# Patient Record
Sex: Male | Born: 1955 | Race: White | Hispanic: No | Marital: Married | Smoking: Former smoker
Health system: Southern US, Community
[De-identification: ages and names within clinical notes are randomized; demographics above are authoritative.]

## PROBLEM LIST (undated history)

## (undated) DIAGNOSIS — Z955 Presence of coronary angioplasty implant and graft: Secondary | ICD-10-CM

## (undated) DIAGNOSIS — K219 Gastro-esophageal reflux disease without esophagitis: Secondary | ICD-10-CM

## (undated) DIAGNOSIS — E785 Hyperlipidemia, unspecified: Secondary | ICD-10-CM

## (undated) DIAGNOSIS — I219 Acute myocardial infarction, unspecified: Secondary | ICD-10-CM

## (undated) DIAGNOSIS — I5022 Chronic systolic (congestive) heart failure: Secondary | ICD-10-CM

## (undated) DIAGNOSIS — Z972 Presence of dental prosthetic device (complete) (partial): Secondary | ICD-10-CM

## (undated) DIAGNOSIS — H8110 Benign paroxysmal vertigo, unspecified ear: Secondary | ICD-10-CM

## (undated) DIAGNOSIS — Z8679 Personal history of other diseases of the circulatory system: Secondary | ICD-10-CM

## (undated) DIAGNOSIS — I209 Angina pectoris, unspecified: Secondary | ICD-10-CM

## (undated) DIAGNOSIS — I25118 Atherosclerotic heart disease of native coronary artery with other forms of angina pectoris: Secondary | ICD-10-CM

## (undated) DIAGNOSIS — I501 Left ventricular failure: Secondary | ICD-10-CM

## (undated) DIAGNOSIS — F419 Anxiety disorder, unspecified: Secondary | ICD-10-CM

## (undated) DIAGNOSIS — Z950 Presence of cardiac pacemaker: Secondary | ICD-10-CM

## (undated) DIAGNOSIS — Z95810 Presence of automatic (implantable) cardiac defibrillator: Secondary | ICD-10-CM

## (undated) DIAGNOSIS — D649 Anemia, unspecified: Secondary | ICD-10-CM

## (undated) DIAGNOSIS — I509 Heart failure, unspecified: Secondary | ICD-10-CM

## (undated) DIAGNOSIS — Z9889 Other specified postprocedural states: Secondary | ICD-10-CM

## (undated) DIAGNOSIS — I2510 Atherosclerotic heart disease of native coronary artery without angina pectoris: Secondary | ICD-10-CM

## (undated) DIAGNOSIS — N200 Calculus of kidney: Secondary | ICD-10-CM

## (undated) DIAGNOSIS — G2581 Restless legs syndrome: Secondary | ICD-10-CM

## (undated) DIAGNOSIS — R42 Dizziness and giddiness: Secondary | ICD-10-CM

## (undated) DIAGNOSIS — IMO0001 Reserved for inherently not codable concepts without codable children: Secondary | ICD-10-CM

## (undated) DIAGNOSIS — Z8659 Personal history of other mental and behavioral disorders: Secondary | ICD-10-CM

## (undated) DIAGNOSIS — N1831 Chronic kidney disease, stage 3a: Secondary | ICD-10-CM

## (undated) HISTORY — PX: CORONARY ANGIOPLASTY: SHX604

## (undated) HISTORY — PX: EXTRACORPOREAL SHOCK WAVE LITHOTRIPSY: SHX1557

## (undated) HISTORY — DX: Hyperlipidemia, unspecified: E78.5

## (undated) HISTORY — PX: CARDIAC CATHETERIZATION: SHX172

## (undated) HISTORY — DX: Gastro-esophageal reflux disease without esophagitis: K21.9

## (undated) HISTORY — PX: CORONARY STENT PLACEMENT: SHX1402

## (undated) HISTORY — DX: Acute myocardial infarction, unspecified: I21.9

## (undated) HISTORY — PX: INSERT / REPLACE / REMOVE PACEMAKER: SUR710

## (undated) HISTORY — PX: PACEMAKER INSERTION: SHX728

---

## 2011-05-06 DIAGNOSIS — I219 Acute myocardial infarction, unspecified: Secondary | ICD-10-CM

## 2011-05-06 HISTORY — DX: Acute myocardial infarction, unspecified: I21.9

## 2011-05-13 ENCOUNTER — Inpatient Hospital Stay: Payer: Self-pay | Admitting: Internal Medicine

## 2011-08-07 ENCOUNTER — Encounter: Payer: Self-pay | Admitting: Internal Medicine

## 2011-09-04 ENCOUNTER — Encounter: Payer: Self-pay | Admitting: Internal Medicine

## 2011-10-04 ENCOUNTER — Encounter: Payer: Self-pay | Admitting: Internal Medicine

## 2012-03-22 DIAGNOSIS — K219 Gastro-esophageal reflux disease without esophagitis: Secondary | ICD-10-CM | POA: Insufficient documentation

## 2012-03-22 DIAGNOSIS — I2510 Atherosclerotic heart disease of native coronary artery without angina pectoris: Secondary | ICD-10-CM | POA: Insufficient documentation

## 2012-03-22 DIAGNOSIS — I10 Essential (primary) hypertension: Secondary | ICD-10-CM | POA: Insufficient documentation

## 2012-03-27 ENCOUNTER — Ambulatory Visit: Payer: Self-pay | Admitting: Cardiology

## 2012-03-27 LAB — URINALYSIS, COMPLETE
Blood: NEGATIVE
Ketone: NEGATIVE
Leukocyte Esterase: NEGATIVE
Nitrite: NEGATIVE
Ph: 7 (ref 4.5–8.0)
Protein: NEGATIVE
Specific Gravity: 1.018 (ref 1.003–1.030)
WBC UR: 1 /HPF (ref 0–5)

## 2012-03-27 LAB — CBC WITH DIFFERENTIAL/PLATELET
Basophil #: 0 10*3/uL (ref 0.0–0.1)
Eosinophil #: 0.1 10*3/uL (ref 0.0–0.7)
HCT: 40.1 % (ref 40.0–52.0)
Lymphocyte #: 2.1 10*3/uL (ref 1.0–3.6)
Lymphocyte %: 26.1 %
MCHC: 35.7 g/dL (ref 32.0–36.0)
Monocyte #: 0.6 x10 3/mm (ref 0.2–1.0)
Monocyte %: 7.4 %
Neutrophil #: 5.3 10*3/uL (ref 1.4–6.5)
Neutrophil %: 64.6 %
Platelet: 260 10*3/uL (ref 150–440)
RBC: 4.52 10*6/uL (ref 4.40–5.90)
RDW: 13.8 % (ref 11.5–14.5)
WBC: 8.2 10*3/uL (ref 3.8–10.6)

## 2012-03-27 LAB — BASIC METABOLIC PANEL
Anion Gap: 8 (ref 7–16)
BUN: 18 mg/dL (ref 7–18)
Co2: 24 mmol/L (ref 21–32)
Creatinine: 1.23 mg/dL (ref 0.60–1.30)
EGFR (African American): >60
EGFR (Non-African Amer.): >60
Potassium: 4.9 mmol/L (ref 3.5–5.1)
Sodium: 136 mmol/L (ref 136–145)

## 2012-03-27 LAB — PROTIME-INR: INR: 1

## 2012-04-05 ENCOUNTER — Ambulatory Visit: Payer: Self-pay | Admitting: Cardiology

## 2013-06-02 ENCOUNTER — Ambulatory Visit: Payer: Self-pay | Admitting: Gastroenterology

## 2013-06-03 LAB — PATHOLOGY REPORT

## 2013-11-10 LAB — BASIC METABOLIC PANEL
Anion Gap: 6 — ABNORMAL LOW (ref 7–16)
BUN: 10 mg/dL (ref 7–18)
CREATININE: 1.2 mg/dL (ref 0.60–1.30)
Calcium, Total: 9 mg/dL (ref 8.5–10.1)
Chloride: 107 mmol/L (ref 98–107)
Co2: 27 mmol/L (ref 21–32)
EGFR (African American): >60
GLUCOSE: 99 mg/dL (ref 65–99)
OSMOLALITY: 278 (ref 275–301)
Potassium: 3.7 mmol/L (ref 3.5–5.1)
SODIUM: 140 mmol/L (ref 136–145)

## 2013-11-10 LAB — CBC WITH DIFFERENTIAL/PLATELET
Basophil #: 0 10*3/uL (ref 0.0–0.1)
Basophil %: 0.2 %
EOS PCT: 0.5 %
Eosinophil #: 0 10*3/uL (ref 0.0–0.7)
HCT: 42.3 % (ref 40.0–52.0)
HGB: 13.4 g/dL (ref 13.0–18.0)
LYMPHS PCT: 34.2 %
Lymphocyte #: 2.3 10*3/uL (ref 1.0–3.6)
MCH: 28.3 pg (ref 26.0–34.0)
MCHC: 31.8 g/dL — ABNORMAL LOW (ref 32.0–36.0)
MCV: 89 fL (ref 80–100)
MONOS PCT: 7.3 %
Monocyte #: 0.5 x10 3/mm (ref 0.2–1.0)
NEUTROS ABS: 3.9 10*3/uL (ref 1.4–6.5)
Neutrophil %: 57.8 %
PLATELETS: 224 10*3/uL (ref 150–440)
RBC: 4.75 10*6/uL (ref 4.40–5.90)
RDW: 13.7 % (ref 11.5–14.5)
WBC: 6.8 10*3/uL (ref 3.8–10.6)

## 2013-11-10 LAB — URINALYSIS, COMPLETE
BACTERIA: NONE SEEN
Bilirubin,UR: NEGATIVE
Blood: NEGATIVE
Glucose,UR: NEGATIVE mg/dL (ref 0–75)
Hyaline Cast: 3
KETONE: NEGATIVE
Leukocyte Esterase: NEGATIVE
Nitrite: NEGATIVE
PROTEIN: NEGATIVE
Ph: 5 (ref 4.5–8.0)
Specific Gravity: 1.024 (ref 1.003–1.030)
Squamous Epithelial: NONE SEEN
WBC UR: 2 /HPF (ref 0–5)

## 2013-11-10 LAB — TROPONIN I: Troponin-I: <0.02 ng/mL

## 2013-11-11 ENCOUNTER — Observation Stay: Payer: Self-pay | Admitting: Family Medicine

## 2013-11-11 LAB — TROPONIN I
Troponin-I: 0.02 ng/mL
Troponin-I: 0.03 ng/mL

## 2013-11-21 DIAGNOSIS — I509 Heart failure, unspecified: Secondary | ICD-10-CM | POA: Insufficient documentation

## 2013-11-21 DIAGNOSIS — I5022 Chronic systolic (congestive) heart failure: Secondary | ICD-10-CM | POA: Insufficient documentation

## 2013-11-21 DIAGNOSIS — R42 Dizziness and giddiness: Secondary | ICD-10-CM | POA: Insufficient documentation

## 2013-11-25 DIAGNOSIS — R55 Syncope and collapse: Secondary | ICD-10-CM | POA: Insufficient documentation

## 2014-03-05 DIAGNOSIS — E782 Mixed hyperlipidemia: Secondary | ICD-10-CM | POA: Insufficient documentation

## 2014-09-22 NOTE — Op Note (Signed)
PATIENT NAME:  Elijah [name], Elijah [name] MR#:  [id number] DATE OF BIRTH:  1956/05/21  DATE OF PROCEDURE:  04/05/2012  PROCEDURE: Implantation of [name] single-chamber implantable cardioverter defibrillator.   INDICATION: Ejection fraction 30%, [place] Heart Association class II symptoms on optimal medical therapy for primary prevention of sudden cardiac arrest.   DETAILS OF PROCEDURE: The patient was brought to the operating room in [name] fasting, nonsedated state. Written informed consent had been obtained and placed in the patient's permanent medical record. The patient was prepped and draped in the usual sterile manner. The area of the left infraclavicular fossa was scrubbed with chlorhexidine. Using 20 mL of local anesthetic, the area of the left infraclavicular fossa was anesthetized. Using [name] scalpel, [name] 3- cm incision was made in the left infraclavicular fossa. Using [name] combination of electrocautery and blunt dissection, an automatic implantable cardiac defibrillator pocket was fastened in the usual manner just above the pectoralis muscle. Access to central circulation was obtained using fluoroscopic guidance in the modified Seldinger technique. [name] guidewire was easily passed through the axillary vein into the area of the inferior vena cava. Over the guidewire, [name] dilator and sheath were placed. The lead was subsequently placed through the dilator after the guidewire and dilator were removed. Once the lead was advanced through the sheath it was taken to the right ventricular outflow tract. The lead was then withdrawn to the mid interventricular septum and the helical active fixation mechanism was deployed. Sensing for R waves in that position were 5.6 mV, slew rate of 1.2 V/sec, impedance 547 ohms, threshold 0.8 Vat 0.5 ms. The lead was secured in place and sewn to the pre pectoralis fascia using 0 silk suture. Adequate redundancy was confirmed under fluoroscopy. The pocket was then irrigated with copious amounts of  gentamicin solution. Adequate hemostasis was obtained. The implantable cardiac defibrillator generator was attached to the implantable cardiac defibrillator lead and secured in the pocket. The pocket was subsequently closed with running layers of 2-0 and 3-0 Vicryl and the subcuticular layer with 4-0 Vicryl. Steri-Strips were applied over the incision site as well as [name] sterile gauze and OpSite, and [name] pressure dressing was applied using Elastoplast. There were no complications noted at the conclusion of the procedure. Fluoroscopy time was 1 minute and 40 seconds. Estimated blood loss was minimal.   SUMMARY OF IMPLANTED HARDWARE: The patient received [name] Medtronic single-chamber implantable cardiac defibrillator, model Protecta S, model number D334VRM, serial number [id number].  The RV lead was [name] Medtronic [id number] 62-cm lead, serial number [id number], all implanted 04/05/2012.  FINAL PROGRAM PARAMETERS: Device was program  mode VVI with lower rate limit of 40. The VF detection was set at 200 beats [name] minute with [name] detection interval of 30 out of 40. There was [name] VT monitor zone programmed at 171 beats per minute.     ____________________________ Anna GenreKevin L. Maisie Fushomas, MD klt:bjt D: 04/05/2012 14:44:48 ET T: 04/05/2012 16:15:30 ET JOB#: [id number]  cc: Caryn BeeKevin L. Maisie Fushomas, MD, <Dictator> Sharion SettlerKEVIN L Toshie Demelo MD ELECTRONICALLY SIGNED 05/10/2012 14:44

## 2014-09-26 NOTE — Discharge Summary (Signed)
PATIENT NAME:  Elijah [name], Elijah [name] MR#:  [id number] DATE OF BIRTH:  22-May-1956  DATE OF ADMISSION:  11/11/2013 DATE OF DISCHARGE:  11/12/2013  DISCHARGE DIAGNOSES: Syncope.   DISCHARGE MEDICATIONS: 1. Ventolin 90 mcg 2 puffs every four hours p.r.n. for wheezing.  2. Aspirin 81 mg p.o. daily.  3. Effient 10 mg  p.o. daily.  4. Ranitidine 150 mg p.o. b.i.d.  5. Lisinopril 5 mg p.o. daily.  6. Vitamin B12 1000 mcg p.o. daily.  7. Ranexa 1000 mg p.o. b.i.d.  8. Pravastatin 40 mg 2 tabs p.o. daily.  9. Spironolactone 25 mg p.o. daily.   MEDICATIONS TO HOLD: Metoprolol.   CONSULTS: Cardiology.   PROCEDURES: [name] CT of the head was negative. Carotid studies were negative. Chest x-ray negative.   PERTINENT LABORATORIES AND STUDIES: Cardiac enzymes were negative. EKG negative.   BRIEF HOSPITAL COURSE: Syncope. The patient initially admitted with syncopal episode. Upon admission his initial work-up was negative. Cardiac enzymes and EKG and electrolytes were all within normal limits. He underwent [name] CT of the head and carotid studies all of which was negative. He was evaluated by cardiology who recommended no further intervention. Concerns that this syncopal episode was probably do to vasovagal episode; however, given his bradycardia that was found upon admission, we held his metoprolol at this time. May need to restart that back for cardiac protection in [name] few weeks. He is to follow up with Dr. Burnadette PopLinthavong within 10 days. The patient is stable to be discharged home.   ____________________________ Marisue IvanKanhka Gae Bihl, MD kl:sg D: 11/12/2013 12:49:26 ET T: 11/12/2013 13:11:39 ET JOB#: [id number]  cc: Marisue IvanKanhka Michon Kaczmarek, MD, <Dictator> Marisue IvanKANHKA Deatra Mcmahen MD ELECTRONICALLY SIGNED 12/02/2013 11:33

## 2014-09-26 NOTE — Consult Note (Signed)
PATIENT NAME:  Elijah [name], Elijah [name] MR#:  [id number] DATE OF BIRTH:  Oct 29, 1955  DATE OF CONSULTATION:  11/11/2013  REFERRING PHYSICIAN:  Dr. Clint [name] CONSULTING PHYSICIAN:  Marcina MillardAlexander Derrian Poli, MD  PRIMARY CARE PHYSICIAN: Dr. Burnadette [name].  CARDIOLOGIST:  Dr. Lady [name].   CHIEF COMPLAINT:  "I passed out."   HISTORY OF PRESENT ILLNESS: The patient is [name] 59 year old gentleman with known history of coronary artery disease, status post prior PCI and coronary stent with known ischemic cardiomyopathy, status post ICD. The patient reports that he was in his usual state of health until last evening while at work when he experienced an episode of nausea followed by lightheadedness and diaphoresis with brief loss of consciousness. The patient reports that it only lasted [name] few seconds. After less than [name] minute, the patient was back to normal. [facility] Room where EKG was nondiagnostic. The patient is ruled out for myocardial infarction by CPK, isoenzymes and troponin. The patient reports that he feels he is back at baseline today.   PAST MEDICAL HISTORY: 1.  Status post posterior wall MI and coronary stent left circumflex 12/12. 2.  Status post coronary stent right coronary artery 01/13.  3.  Ischemic cardiomyopathy with LVEF of 30%.  4.  Status post ICD 11/13.  5.  Hypertension.  6.  Hyperlipidemia.   MEDICATIONS: Metoprolol succinate 25 mg daily, Ranexa 1000 mg daily, Pravachol 80 mg daily, Effient 10 mg daily, lisinopril 5 mg daily, aspirin 81 mg daily, spironolactone 25 mg daily, MiraLAX 255 grams daily, ranitidine 150 mg b.i.d., Ventolin inhaler 90 mcg 2 puffs q. 6 hours p.r.n., vitamin B12 1000 mg daily.   SOCIAL HISTORY: The patient is married, lives with his wife. He works full time. He quit tobacco abuse 2011.   FAMILY HISTORY: No immediate family history of coronary artery disease or myocardial infarction.  REVIEW OF SYSTEMS:  CONSTITUTIONAL: No fever or chills.  EYES: No  blurry vision.  EARS: No hearing loss.  RESPIRATORY: No shortness of breath.  CARDIOVASCULAR: No chest pain.  GASTROINTESTINAL: No nausea, vomiting or diarrhea.  GENITOURINARY: No dysuria or hematuria.  ENDOCRINE: No polyuria or polydipsia.  MUSCULOSKELETAL: No arthralgias or myalgias.  NEUROLOGICAL: No focal muscle weakness or numbness.  PSYCHOLOGICAL: No depression or anxiety.   PHYSICAL EXAMINATION: HEENT: Pupils equal and reactive to light and accommodation.  NECK: Supple without thyromegaly.  LUNGS: Clear.  CARDIOVASCULAR: Normal JVP. Normal PMI. Regular rate and rhythm. Normal S1, S2. No appreciable gallop, murmur or rub.  ABDOMEN: Soft and nontender. Pulses were intact bilaterally.  MUSCULOSKELETAL: Normal muscle tone.  NEUROLOGIC: The patient is alert and oriented x 3. Motor and sensory both grossly intact.   IMPRESSION: [name] 59 year old gentleman with [name] syncopal episode which sounds vasovagal in nature. The patient denies any delivered shocks from his defibrillator. The patient did not experience any chest pain or symptoms of congestive heart failure. The patient has ruled out for myocardial infarction by CPK, isoenzymes and troponin.   RECOMMENDATIONS: 1.  Agree with overall current therapy.  2.  Would defer full dose anticoagulation. 3.  Await brain CT and carotid ultrasound results.  4.  Would defer further cardiac diagnostics at this time.  5.  If patient does well overnight, may consider discharge in the morning.   ____________________________ Marcina MillardAlexander Annora Guderian, MD ap:ce D: 11/11/2013 17:03:51 ET T: 11/11/2013 19:02:20 ET JOB#: [id number]  cc: Marcina MillardAlexander Garison Genova, MD, <Dictator> Marcina MillardALEXANDER Brockton Mckesson MD ELECTRONICALLY SIGNED 11/18/2013 8:29

## 2014-09-26 NOTE — H&P (Signed)
PATIENT NAME:  [name], Elijah A MR#:  [id number] DATE OF BIRTH:  1956/01/03  DATE OF ADMISSION:  11/10/2013  REFERRING PHYSICIAN: Carollee Massed.   PRIMARY CARE PHYSICIAN: Linthavong.    CHIEF COMPLAINT: Passing out.   HISTORY OF PRESENT ILLNESS: A 59 year old Caucasian gentleman with past medical history of coronary artery disease status post PCI and stent placement, ischemic cardiomyopathy with unknown ejection fraction; however, he does have a permanent pacemaker and AICD that were placed, presenting now for a syncopal episode. Describes acute onset of syncope with preceding prodrome of nausea, lightheadedness, diaphoresis, followed by loss of consciousness without associated head trauma. His loss of consciousness lasted only a few seconds. Afterward still had some nauseousness but no further symptomatology. Denies any chest pain, palpitations or shortness of breath. Currently, he is without complaints.   REVIEW OF SYSTEMS:  CONSTITUTIONAL: Denies fever, fatigue, weakness.  EYES: Denies blurred vision, double vision, eye pain.  EARS, NOSE, THROAT: Denies tinnitus, ear pain, hearing loss.  RESPIRATORY: Denies cough, wheeze, shortness of breath.  CARDIOVASCULAR: Denies chest pain, palpitations, edema. Positive for syncope as described above.  GASTROINTESTINAL: Positive for nausea as described above. Denies vomiting, diarrhea, abdominal pain.  GENITOURINARY: Denies dysuria or hematuria.  ENDOCRINE: Denies nocturia or thyroid problems.  HEMATOLOGIC AND LYMPHATIC: Denies easy bruising or bleeding.  SKIN: Denies rashes or lesions.  MUSCULOSKELETAL: Denies pain in neck, back, shoulder, knees, hips or arthritic symptoms.  NEUROLOGIC: Denies paralysis, paresthesias.  PSYCHIATRIC: Denies anxiety or depressive symptoms.   Otherwise, full review of systems performed by me is negative.   PAST MEDICAL HISTORY: Coronary artery disease status post PCI and stent placement, ischemic cardiomyopathy,  hyperlipidemia, gastroesophageal reflux disease, history of permanent pacemaker insertion as well as AICD placement.   SOCIAL HISTORY: Remote tobacco use. Denies any alcohol usage or drug usage.   FAMILY HISTORY: Denies any known cardiovascular or pulmonary disorders.   ALLERGIES: SULFA DRUGS AND VICODIN.   HOME MEDICATIONS: Include spironolactone 25 mg p.o. daily, aspirin 81 mg p.o. daily, lisinopril 5 mg p.o. daily, Ranexa 1000 mg p.o. b.i.d., pravastatin 40 mg 2 tablets p.o. daily, Effient 10 mg p.o. daily, metoprolol succinate 25 mg extended release p.o. daily, Ventolin 90 mcg inhalation 2 puffs 4 times daily as needed for shortness of breath, ranitidine 150 mg p.o. b.i.d., vitamin B12 1000 mcg p.o. daily.   PHYSICAL EXAMINATION:  VITAL SIGNS: Temperature 98.4, heart rate 62, respirations 18, blood pressure 143/82, saturating 97% on room air. Weight 77.1 kg, BMI 26.7.  GENERAL: Well-nourished, well-developed, Caucasian gentleman, currently in no acute distress.  HEAD: Normocephalic, atraumatic.  EYES: Pupils equal, round and reactive to light. Extraocular muscles intact. No scleral icterus.  MOUTH: Moist mucosal membranes. Dentition intact. No abscess noted.  EARS, NOSE, THROAT: Clear without exudates. No external lesions.  NECK: Supple. No thyromegaly. No nodules. No JVD.  PULMONARY: Clear to auscultation bilaterally without wheezes, rubs or rhonchi. No use of accessory muscles. Good respiratory effort.  CHEST: Nontender to palpation.  CARDIOVASCULAR: S1, S2, bradycardic. No murmurs, rubs or gallops. No edema. Pedal pulses 2+ bilaterally.  GASTROINTESTINAL: Soft, nontender, nondistended. No masses. Positive bowel sounds. No hepatosplenomegaly.  MUSCULOSKELETAL: No swelling, clubbing or edema. Range of motion full in all extremities.  NEUROLOGIC: Cranial nerves II through XII intact. No gross focal neurological deficits. Sensation intact. Reflexes intact.  SKIN: No ulceration, lesions,  rashes, cyanosis. Skin warm, dry. Turgor intact.  PSYCHIATRIC: Mood and affect within normal limits. The patient is awake, alert, oriented x 3. Insight  and judgment intact.   LABORATORY DATA: Sodium 140, potassium 3.7, chloride 107, bicarb 27, BUN 10, creatinine 1.2, glucose 99. Troponin I less than 0.02. WBC 6.8, hemoglobin 13.4, platelets 224. Urinalysis negative for evidence of infection. EKG performed revealing sinus bradycardia, heart rate 53.   ASSESSMENT AND PLAN: A 59 year old Caucasian gentleman with history of coronary artery disease status post percutaneous coronary intervention and stent placement, ischemic cardiomyopathy with unknown ejection fraction; however, he does have permanent pacemaker and automatic implantable cardiac defibrillator placement, presenting after a syncopal episode.  1. Syncope: Place on telemetry under observational status. Consult cardiology. Will need his automatic implantable cardiac defibrillator and permanent pacemaker checked given bradycardia.  2. Bradycardia: Blood pressure stable. Will hold beta blockers and atrioventricular nodal agents.  3. Coronary artery disease: Continue aspirin and statin therapy, as well as Effient.  4. Hypertension: Continue ACE inhibitors and spironolactone; however, hold his Toprol.  5. Gastroesophageal reflux disease: Continue Zantac.   6. Venous thromboembolism prophylaxis: Continue with Effient.   The patient is FULL CODE.   TIME SPENT: 45 minutes.   ____________________________ Cletis Athensavid K. Zineb Glade, MD dkh:gb D: 11/10/2013 22:32:08 ET T: 11/10/2013 22:47:37 ET JOB#: [id number]  cc: Cletis Athensavid K. Holland Nickson, MD, <Dictator> Zaylen Susman Synetta ShadowK Caelyn Route MD ELECTRONICALLY SIGNED 11/11/2013 21:05

## 2014-09-27 NOTE — Discharge Summary (Signed)
PATIENT NAME:  Elijah [name], Elijah [name] MR#:  [id number] DATE OF BIRTH:  07/15/1955  DATE OF ADMISSION:  05/13/2011 DATE OF DISCHARGE:  05/16/2011  DISCHARGE DIAGNOSES:  1. Known coronary artery disease with unstable angina.  2. Acute posterolateral myocardial infarction.  3. Coronary artery disease.  4. Hyperlipidemia.   HISTORY: This is [name] 59 year old male with known cardiovascular disease with progressive angina. Patient has underwent cardiac catheterization showing [name] chronically occluded right coronary artery with collateralization with [name] significant stenosis of an obtuse marginal of 85%. The left anterior descending artery had minor irregularities. The patient had [name] PCI and stent placement of the circumflex artery to obtuse marginal without complication. Later that evening the patient did have some nausea and began having some mild EKG changes and then the patient had further discomfort in the morning. The patient was placed back on Integrilin, heparin and nitroglycerin with [name] headache and therefore ended up with [name] troponin of 30. The patient was continued on appropriate medications throughout the weekend until he felt much better. At that time the patient had discussion of possible further intervention and he wished to use medical management of potential problems that had occurred including the possibility of acute occlusion of the circumflex obtuse marginal stent and/or further changes to the right coronary artery. The patient then underwent [name] treadmill EKG showing some mild ST depression but reasonable exercise tolerance and no rhythm disturbances. The patient had some weakness. The patient therefore needed further evaluation and had cardiac catheterization repeated showing occluded obtuse marginal at the stent site with completed myocardial infarction and no change in collateralization to right coronary artery which he was [name] subacute occlusion from previous. The patient then was ambulating well without  any further significant symptoms and had been on appropriate medications. The patient could not tolerate any beta blocker due to hypotension and ACE inhibitor due to hypotension therefore Plavix and Effient were used and the patient felt fairly well. He was discharged home in good condition with follow up in two days for further evaluation and treatment options.   DISCHARGE MEDICATIONS:  1. Effient 10 mg. 2. Aspirin 325 mg each day.  3. Pravastatin 20 mg p.o. daily.   ____________________________ Lamar BlinksBruce J. Madylin Fairbank, MD bjk:cms D: 05/16/2011 16:51:27 ET T: 05/17/2011 11:32:40 ET JOB#: [id number]  cc: Lamar BlinksBruce J. Janeliz Prestwood, MD, <Dictator> Lamar BlinksBRUCE J Tarina Volk MD ELECTRONICALLY SIGNED 06/12/2011 9:46

## 2015-02-15 ENCOUNTER — Emergency Department
Admission: EM | Admit: 2015-02-15 | Discharge: 2015-02-15 | Disposition: A | Discharge status: Home / Self Care | Payer: Managed Care, Other (non HMO) | Attending: Emergency Medicine | Admitting: Emergency Medicine

## 2015-02-15 ENCOUNTER — Encounter: Payer: Self-pay | Admitting: Emergency Medicine

## 2015-02-15 ENCOUNTER — Emergency Department: Payer: Managed Care, Other (non HMO)

## 2015-02-15 DIAGNOSIS — R1032 Left lower quadrant pain: Secondary | ICD-10-CM | POA: Diagnosis present

## 2015-02-15 DIAGNOSIS — Z79899 Other long term (current) drug therapy: Secondary | ICD-10-CM | POA: Insufficient documentation

## 2015-02-15 DIAGNOSIS — Z7982 Long term (current) use of aspirin: Secondary | ICD-10-CM | POA: Diagnosis not present

## 2015-02-15 DIAGNOSIS — N23 Unspecified renal colic: Secondary | ICD-10-CM | POA: Insufficient documentation

## 2015-02-15 DIAGNOSIS — Z7902 Long term (current) use of antithrombotics/antiplatelets: Secondary | ICD-10-CM | POA: Insufficient documentation

## 2015-02-15 HISTORY — DX: Calculus of kidney: N20.0

## 2015-02-15 LAB — URINALYSIS COMPLETE WITH MICROSCOPIC (ARMC ONLY)
BACTERIA UA: NONE SEEN
Bilirubin Urine: NEGATIVE
Glucose, UA: NEGATIVE mg/dL
Ketones, ur: NEGATIVE mg/dL
LEUKOCYTES UA: NEGATIVE
NITRITE: NEGATIVE
PROTEIN: NEGATIVE mg/dL
SPECIFIC GRAVITY, URINE: 1.019 (ref 1.005–1.030)
pH: 5 (ref 5.0–8.0)

## 2015-02-15 LAB — COMPREHENSIVE METABOLIC PANEL
ALBUMIN: 3.9 g/dL (ref 3.5–5.0)
ALK PHOS: 53 U/L (ref 38–126)
ALT: 14 U/L — AB (ref 17–63)
ANION GAP: 8 (ref 5–15)
AST: 27 U/L (ref 15–41)
BILIRUBIN TOTAL: 1 mg/dL (ref 0.3–1.2)
BUN: 13 mg/dL (ref 6–20)
CALCIUM: 9.1 mg/dL (ref 8.9–10.3)
CO2: 24 mmol/L (ref 22–32)
CREATININE: 1.25 mg/dL — AB (ref 0.61–1.24)
Chloride: 109 mmol/L (ref 101–111)
GFR calc non Af Amer: >60 mL/min (ref >60)
GLUCOSE: 117 mg/dL — AB (ref 65–99)
Potassium: 3.6 mmol/L (ref 3.5–5.1)
SODIUM: 141 mmol/L (ref 135–145)
TOTAL PROTEIN: 6.8 g/dL (ref 6.5–8.1)

## 2015-02-15 LAB — CBC WITH DIFFERENTIAL/PLATELET
BASOS ABS: 0 10*3/uL (ref 0–0.1)
BASOS PCT: 0 %
EOS ABS: 0.1 10*3/uL (ref 0–0.7)
Eosinophils Relative: 2 %
HCT: 38.3 % — ABNORMAL LOW (ref 40.0–52.0)
Hemoglobin: 12.9 g/dL — ABNORMAL LOW (ref 13.0–18.0)
Lymphocytes Relative: 35 %
Lymphs Abs: 2.2 10*3/uL (ref 1.0–3.6)
MCH: 30.2 pg (ref 26.0–34.0)
MCHC: 33.6 g/dL (ref 32.0–36.0)
MCV: 89.7 fL (ref 80.0–100.0)
MONO ABS: 0.5 10*3/uL (ref 0.2–1.0)
MONOS PCT: 9 %
NEUTROS ABS: 3.3 10*3/uL (ref 1.4–6.5)
Neutrophils Relative %: 54 %
PLATELETS: 199 10*3/uL (ref 150–440)
RBC: 4.27 MIL/uL — ABNORMAL LOW (ref 4.40–5.90)
RDW: 13.8 % (ref 11.5–14.5)
WBC: 6.2 10*3/uL (ref 3.8–10.6)

## 2015-02-15 MED ORDER — OXYCODONE-ACETAMINOPHEN 5-325 MG PO TABS: 1.0000 | ORAL_TABLET | Freq: Four times a day (QID) | ORAL | Status: DC | PRN

## 2015-02-15 MED ORDER — ONDANSETRON HCL 4 MG/2ML IJ SOLN
4.0000 mg | Freq: Once | INTRAMUSCULAR | Status: AC
  Administered 2015-02-15: 4 mg via INTRAVENOUS
  Filled 2015-02-15: qty 2

## 2015-02-15 MED ORDER — ONDANSETRON 8 MG PO TBDP: 8.0000 mg | ORAL_TABLET | Freq: Three times a day (TID) | ORAL | Status: DC | PRN

## 2015-02-15 MED ORDER — KETOROLAC TROMETHAMINE 30 MG/ML IJ SOLN
30.0000 mg | Freq: Once | INTRAMUSCULAR | Status: AC
  Administered 2015-02-15: 30 mg via INTRAVENOUS
  Filled 2015-02-15: qty 1

## 2015-02-15 MED ORDER — NAPROXEN 500 MG PO TABS: 500.0000 mg | ORAL_TABLET | Freq: Two times a day (BID) | ORAL | Status: DC

## 2015-02-15 MED ORDER — TAMSULOSIN HCL 0.4 MG PO CAPS: 0.4000 mg | ORAL_CAPSULE | Freq: Every day | ORAL | Status: DC

## 2015-02-15 NOTE — ED Notes (Signed)
Pt presents with left flank acute onset this am. Hx of kidney stones.

## 2015-02-15 NOTE — Discharge Instructions (Signed)
You were prescribed a medication that is potentially sedating. Do not drink alcohol, drive or participate in any other potentially dangerous activities while taking this medication as it may make you sleepy. Do not take this medication with any other sedating medications, either prescription or over-the-counter. If you were prescribed Percocet or Vicodin, do not take these with acetaminophen (Tylenol) as it is already contained within these medications. °  °Opioid pain medications (or "narcotics") can be habit forming.  Use it as little as possible to achieve adequate pain control.  Do not use or use it with extreme caution if you have a history of opiate abuse or dependence.  If you are on a pain contract with your primary care doctor or a pain specialist, be sure to let them know you were prescribed this medication today from the [facility].  This medication is intended for your use only - do not give any to anyone else and keep it in a secure place where nobody else, especially children and pets, have access to it.  It will also cause or worsen constipation, so you may want to consider taking an over-the-counter stool softener while you are taking this medication. ° ° °Kidney Stones °Kidney stones (urolithiasis) are deposits that form inside your kidneys. The intense pain is caused by the stone moving through the urinary tract. When the stone moves, the ureter goes into spasm around the stone. The stone is usually passed in the urine.  °CAUSES  °· A disorder that makes certain neck glands produce too much parathyroid hormone (primary hyperparathyroidism). °· A buildup of uric acid crystals, similar to gout in your joints. °· Narrowing (stricture) of the ureter. °· A kidney obstruction present at birth (congenital obstruction). °· Previous surgery on the kidney or ureters. °· Numerous kidney infections. °SYMPTOMS  °· Feeling sick to your stomach (nauseous). °· Throwing up  (vomiting). °· Blood in the urine (hematuria). °· Pain that usually spreads (radiates) to the groin. °· Frequency or urgency of urination. °DIAGNOSIS  °· Taking a history and physical exam. °· Blood or urine tests. °· CT scan. °· Occasionally, an examination of the inside of the urinary bladder (cystoscopy) is performed. °TREATMENT  °· Observation. °· Increasing your fluid intake. °· Extracorporeal shock wave lithotripsy--This is a noninvasive procedure that uses shock waves to break up kidney stones. °· Surgery may be needed if you have severe pain or persistent obstruction. There are various surgical procedures. Most of the procedures are performed with the use of small instruments. Only small incisions are needed to accommodate these instruments, so recovery time is minimized. °The size, location, and chemical composition are all important variables that will determine the proper choice of action for you. Talk to your health care provider to better understand your situation so that you will minimize the risk of injury to yourself and your kidney.  °HOME CARE INSTRUCTIONS  °· Drink enough water and fluids to keep your urine clear or pale yellow. This will help you to pass the stone or stone fragments. °· Strain all urine through the provided strainer. Keep all particulate matter and stones for your health care provider to see. The stone causing the pain may be as small as a grain of salt. It is very important to use the strainer each and every time you pass your urine. The collection of your stone will allow your health care provider to analyze it and verify that a stone has actually passed. The stone analysis will   often identify what you can do to reduce the incidence of recurrences. °· Only take over-the-counter or prescription medicines for pain, discomfort, or fever as directed by your health care provider. °· Make a follow-up appointment with your health care provider as directed. °· Get follow-up X-rays if  required. The absence of pain does not always mean that the stone has passed. It may have only stopped moving. If the urine remains completely obstructed, it can cause loss of kidney function or even complete destruction of the kidney. It is your responsibility to make sure X-rays and follow-ups are completed. Ultrasounds of the kidney can show blockages and the status of the kidney. Ultrasounds are not associated with any radiation and can be performed easily in a matter of minutes. °SEEK MEDICAL CARE IF: °· You experience pain that is progressive and unresponsive to any pain medicine you have been prescribed. °SEEK IMMEDIATE MEDICAL CARE IF:  °· Pain cannot be controlled with the prescribed medicine. °· You have a fever or shaking chills. °· The severity or intensity of pain increases over 18 hours and is not relieved by pain medicine. °· You develop a new onset of abdominal pain. °· You feel faint or pass out. °· You are unable to urinate. °MAKE SURE YOU:  °· Understand these instructions. °· Will watch your condition. °· Will get help right away if you are not doing well or get worse. °Document Released: 05/22/2005 Document Revised: 01/22/2013 Document Reviewed: 10/23/2012 °[employer]® Patient Information ©2015 [employer]. This information is not intended to replace advice given to you by your health care provider. Make sure you discuss any questions you have with your health care provider. ° °

## 2015-02-15 NOTE — ED Provider Notes (Signed)
[facility] Emergency Department Provider Note  ____________________________________________  Time seen: 8:15 AM  I have reviewed the triage vital signs and the nursing notes.   HISTORY  Chief Complaint Flank Pain    HPI Elijah [name] is a 59 y.o. male who complains of acute onset of left flank pain radiating around to the left lower quadrant at about 6:30 this morning on his way to work. He has a history of kidney stones and this feels similar. No dysuria frequency urgency or hematuria. He did have an episode of left flank pain 1 week ago which since resolved. No fevers chills or other symptoms in the intervening time or presently. No chest pain shortness of breath. No dizziness or syncope.  Pain is sharp and constant since 6:30, no aggravating or alleviating factors, severe.     Past Medical History  Diagnosis Date  . Kidney stones      There are no active problems to display for this patient.    No past surgical history on file. None  Current Outpatient Rx  Name  Route  Sig  Dispense  Refill  . aspirin EC 81 MG tablet   Oral   Take 1 tablet by mouth daily.         Marland Kitchen atorvastatin (LIPITOR) 40 MG tablet   Oral   Take 1 tablet by mouth daily.         Marland Kitchen EFFIENT 10 MG TABS tablet   Oral   Take 1 tablet by mouth daily.           Dispense as written.   Marland Kitchen omeprazole (PRILOSEC OTC) 20 MG tablet   Oral   Take 20 mg by mouth daily.         Marland Kitchen RANEXA 1000 MG SR tablet   Oral   Take 1 tablet by mouth 2 (two) times daily.           Dispense as written.   Marland Kitchen spironolactone (ALDACTONE) 25 MG tablet   Oral   Take 1 tablet by mouth daily.         . vitamin B-12 (CYANOCOBALAMIN) 1000 MCG tablet   Oral   Take 1 tablet by mouth daily.         . naproxen (NAPROSYN) 500 MG tablet   Oral   Take 1 tablet (500 mg total) by mouth 2 (two) times daily with a meal.   20 tablet   0   . ondansetron (ZOFRAN ODT) 8 MG  disintegrating tablet   Oral   Take 1 tablet (8 mg total) by mouth every 8 (eight) hours as needed for nausea or vomiting.   20 tablet   0   . oxyCODONE-acetaminophen (ROXICET) 5-325 MG per tablet   Oral   Take 1 tablet by mouth every 6 (six) hours as needed for severe pain.   10 tablet   0   . tamsulosin (FLOMAX) 0.4 MG CAPS capsule   Oral   Take 1 capsule (0.4 mg total) by mouth daily.   30 capsule   0      Allergies Hydrocodone-acetaminophen; Morphine; and Sulfa antibiotics   No family history on file.  Social History Social History  Substance Use Topics  . Smoking status: None  . Smokeless tobacco: None  . Alcohol Use: None   no tobacco alcohol or drug use  Review of Systems  Constitutional:   No fever or chills. No weight changes Eyes:   No blurry vision or  double vision.  ENT:   No sore throat. Cardiovascular:   No chest pain. Respiratory:   No dyspnea or cough. Gastrointestinal:   Left flank pain without vomiting and diarrhea.  No BRBPR or melena. Genitourinary:   Negative for dysuria, urinary retention, bloody urine, or difficulty urinating. Musculoskeletal:   Negative for back pain. No joint swelling or pain. Skin:   Negative for rash. Neurological:   Negative for headaches, focal weakness or numbness. Psychiatric:  No anxiety or depression.   Endocrine:  No hot/cold intolerance, changes in energy, or sleep difficulty.  10-point ROS otherwise negative.  ____________________________________________   PHYSICAL EXAM:  VITAL SIGNS: ED Triage Vitals  Enc Vitals Group     BP 02/15/15 0711 148/81 mmHg     Pulse Rate 02/15/15 0711 64     Resp 02/15/15 0711 18     Temp 02/15/15 0711 97.5 F (36.4 C)     Temp Source 02/15/15 0711 Oral     SpO2 02/15/15 0711 99 %     Weight 02/15/15 0711 175 lb (79.379 kg)     Height 02/15/15 0711  (1.727 m)     Head Cir --      Peak Flow --      Pain Score 02/15/15 0710 8     Pain Loc --      Pain Edu? --       Excl. in GC? --      Constitutional:   Alert and oriented. Well appearing and in no distress. Eyes:   No scleral icterus. No conjunctival pallor. PERRL. EOMI ENT   Head:   Normocephalic and atraumatic.   Nose:   No congestion/rhinnorhea. No septal hematoma   Mouth/Throat:   MMM, no pharyngeal erythema. No peritonsillar mass. No uvula shift.   Neck:   No stridor. No SubQ emphysema. No meningismus. Hematological/Lymphatic/Immunilogical:   No cervical lymphadenopathy. Cardiovascular:   RRR. Normal and symmetric distal pulses are present in all extremities. No murmurs, rubs, or gallops. Respiratory:   Normal respiratory effort without tachypnea nor retractions. Breath sounds are clear and equal bilaterally. No wheezes/rales/rhonchi. Gastrointestinal:   Soft and nontender. No distention. There is no CVA tenderness.  No rebound, rigidity, or guarding. Genitourinary:   deferred Musculoskeletal:   Nontender with normal range of motion in all extremities. No joint effusions.  No lower extremity tenderness.  No edema. Neurologic:   Normal speech and language.  CN 2-10 normal. Motor grossly intact. No pronator drift.  Normal gait. No gross focal neurologic deficits are appreciated.  Skin:    Skin is warm, dry and intact. No rash noted.  No petechiae, purpura, or bullae. Psychiatric:   Mood and affect are normal. Speech and behavior are normal. Patient exhibits appropriate insight and judgment.  ____________________________________________    LABS (pertinent positives/negatives) (all labs ordered are listed, but only abnormal results are displayed) Labs Reviewed  URINALYSIS COMPLETEWITH MICROSCOPIC ([facility] ONLY) - Abnormal; Notable for the following:    Color, Urine YELLOW (*)    APPearance CLEAR (*)    Hgb urine dipstick 3+ (*)    Squamous Epithelial / LPF 0-5 (*)    All other components within normal limits  COMPREHENSIVE METABOLIC PANEL - Abnormal; Notable for the  following:    Glucose, Bld 117 (*)    Creatinine, Ser 1.25 (*)    ALT 14 (*)    All other components within normal limits  CBC WITH DIFFERENTIAL/PLATELET - Abnormal; Notable for the following:  RBC 4.27 (*)    Hemoglobin 12.9 (*)    HCT 38.3 (*)    All other components within normal limits   ____________________________________________   EKG    ____________________________________________    RADIOLOGY  KUB reveals right nephrolithiasis no other acute findings  ____________________________________________   PROCEDURES   ____________________________________________   INITIAL IMPRESSION / ASSESSMENT AND PLAN / ED COURSE  Pertinent labs & imaging results that were available during my care of the patient were reviewed by me and considered in my medical decision making (see chart for details).  Patient presents with left renal colic. Urinalysis reveals too numerous to count red blood cells. Labs and exam are otherwise reassuring. We'll treat the renal colic with pain medicine and nausea medicine as well as Flomax. This patient has had 2 prior kidney stones this year including similar symptoms about a week ago, we will encourage him to follow up with urology for further evaluation.     ____________________________________________   FINAL CLINICAL IMPRESSION(S) / ED DIAGNOSES  Final diagnoses:  Renal colic on left side      Sharman Cheek, MD 02/15/15 [phone]

## 2015-02-19 ENCOUNTER — Encounter: Payer: Self-pay | Admitting: Obstetrics and Gynecology

## 2015-02-19 ENCOUNTER — Ambulatory Visit (INDEPENDENT_AMBULATORY_CARE_PROVIDER_SITE_OTHER): Payer: Managed Care, Other (non HMO) | Admitting: Obstetrics and Gynecology

## 2015-02-19 VITALS — BP 126/81 | HR 75 | Ht 68.0 in | Wt 184.6 lb

## 2015-02-19 DIAGNOSIS — R310 Gross hematuria: Secondary | ICD-10-CM | POA: Diagnosis not present

## 2015-02-19 DIAGNOSIS — R109 Unspecified abdominal pain: Secondary | ICD-10-CM | POA: Diagnosis not present

## 2015-02-19 LAB — URINALYSIS, COMPLETE
Bilirubin, UA: NEGATIVE
Glucose, UA: NEGATIVE
Ketones, UA: NEGATIVE
Nitrite, UA: NEGATIVE
PH UA: 5.5 (ref 5.0–7.5)
PROTEIN UA: NEGATIVE
Specific Gravity, UA: 1.02 (ref 1.005–1.030)
Urobilinogen, Ur: 0.2 mg/dL (ref 0.2–1.0)

## 2015-02-19 LAB — MICROSCOPIC EXAMINATION
BACTERIA UA: NONE SEEN
RBC, UA: >30 /hpf — ABNORMAL HIGH (ref 0–?)

## 2015-02-19 NOTE — Progress Notes (Signed)
02/19/2015 4:24 PM   Elijah [name] May 13, 1956 [id number]  Referring provider: Marisue Ivan, MD [address] [place], [place] [address]  Chief Complaint  Patient presents with  . Nephrolithiasis    new pt    HPI: Patient is a 59 year old male presenting for follow-up after being seen in the emergency department for left flank pain. Approximately 6 mm seen in the mid pole region of the right kidney on KUB. No further imaging was done. Creatinine 1.25 but not significantly elevated from prior baseline levels. She does have a history of kidney stones. He reports 6-7 prior stone episodes and 2 prior ESWLs for stone removal. Last ESWL 10 years ago. He states that he has passed 2 prior stones this year. Patient reports pain significantly improved today. He was prescribed Flomax and pain medication while in the emergency department. No nausea vomiting or fevers.  He does report a few episodes of gross hematuria over the last few weeks. He is a former 25 year 2 pack per day smoker. He quit 5 years ago.  PCP draws annual PSA per patient but does ot perform DRE. No family history of prostate cancers.  PMH: Past Medical History  Diagnosis Date  . Kidney stones   . Acid reflux   . Heart attack   . Hyperlipidemia     Surgical History: Past Surgical History  Procedure Laterality Date  . Coronary stent placement    . Pacemaker insertion      Home Medications:    Medication List       This list is accurate as of: 02/19/15  4:24 PM.  Always use your most recent med list.               aspirin EC 81 MG tablet  Take 1 tablet by mouth daily.     atorvastatin 40 MG tablet  Commonly known as:  LIPITOR  Take 1 tablet by mouth daily.     EFFIENT 10 MG Tabs tablet  Generic drug:  prasugrel  Take 1 tablet by mouth daily.     naproxen 500 MG tablet  Commonly known as:  NAPROSYN  Take 1 tablet (500 mg total) by mouth 2 (two) times daily with a meal.     omeprazole 20  MG tablet  Commonly known as:  PRILOSEC OTC  Take 20 mg by mouth daily.     ondansetron 8 MG disintegrating tablet  Commonly known as:  ZOFRAN ODT  Take 1 tablet (8 mg total) by mouth every 8 (eight) hours as needed for nausea or vomiting.     oxyCODONE-acetaminophen 5-325 MG per tablet  Commonly known as:  ROXICET  Take 1 tablet by mouth every 6 (six) hours as needed for severe pain.     RANEXA 1000 MG SR tablet  Generic drug:  ranolazine  Take 1 tablet by mouth 2 (two) times daily.     spironolactone 25 MG tablet  Commonly known as:  ALDACTONE  Take 1 tablet by mouth daily.     tamsulosin 0.4 MG Caps capsule  Commonly known as:  FLOMAX  Take 1 capsule (0.4 mg total) by mouth daily.     vitamin B-12 1000 MCG tablet  Commonly known as:  CYANOCOBALAMIN  Take 1 tablet by mouth daily.        Allergies:  Allergies  Allergen Reactions  . Hydrocodone-Acetaminophen Nausea And Vomiting    Causes vomiting and GI upset  . Morphine Nausea And Vomiting  Causes vomiting and GI upset  . Sulfa Antibiotics Rash    Family History: Family History  Problem Relation Age of Onset  . Urolithiasis Neg Hx   . Prostate cancer Neg Hx   . Kidney disease Neg Hx   . Kidney cancer Neg Hx     Social History:  reports that he has quit smoking. He does not have any smokeless tobacco history on file. He reports that he does not drink alcohol or use illicit drugs.  ROS: UROLOGY Frequent Urination?: No Hard to postpone urination?: No Burning/pain with urination?: No Get up at night to urinate?: Yes Leakage of urine?: No Urine stream starts and stops?: No Trouble starting stream?: No Do you have to strain to urinate?: No Blood in urine?: Yes Urinary tract infection?: No Sexually transmitted disease?: No Injury to kidneys or bladder?: No Painful intercourse?: No Weak stream?: No Erection problems?: Yes Penile pain?: No  Gastrointestinal Nausea?: Yes Vomiting?:  Yes Indigestion/heartburn?: Yes Diarrhea?: No Constipation?: Yes  Constitutional Fever: Yes Night sweats?: No Weight loss?: No Fatigue?: Yes  Skin Skin rash/lesions?: No Itching?: No  Eyes Blurred vision?: Yes Double vision?: No  Ears/Nose/Throat Sore throat?: No Sinus problems?: No  Hematologic/Lymphatic Swollen glands?: No Easy bruising?: Yes  Cardiovascular Leg swelling?: No Chest pain?: No  Respiratory Cough?: No Shortness of breath?: Yes  Endocrine Excessive thirst?: No  Musculoskeletal Back pain?: Yes Joint pain?: No  Neurological Headaches?: Yes Dizziness?: Yes  Psychologic Depression?: No Anxiety?: No  Physical Exam: BP 126/81 mmHg  Pulse 75  Ht 5\' 8"  (1.727 m)  Wt 184 lb 9.6 oz (83.734 kg)  BMI 28.07 kg/m2  Constitutional:  Alert and oriented, No acute distress. HEENT: [place] AT, moist mucus membranes.  Trachea midline, no masses. Cardiovascular: No clubbing, cyanosis, or edema. Respiratory: Normal respiratory effort, no increased work of breathing. GI: Abdomen is soft, nontender, nondistended, no abdominal masses GU: No CVA tenderness.  DRE; patient declined Skin: No rashes, bruises or suspicious lesions. Lymph: No cervical or inguinal adenopathy. Neurologic: Grossly intact, no focal deficits, moving all 4 extremities. Psychiatric: Normal mood and affect.  Laboratory Data:   Urinalysis    Component Value Date/Time   COLORURINE YELLOW* 02/15/2015 0712   COLORURINE Yellow 11/10/2013 2124   APPEARANCEUR CLEAR* 02/15/2015 0712   APPEARANCEUR Clear 11/10/2013 2124   LABSPEC 1.019 02/15/2015 0712   LABSPEC 1.024 11/10/2013 2124   PHURINE 5.0 02/15/2015 0712   PHURINE 5.0 11/10/2013 2124   GLUCOSEU NEGATIVE 02/15/2015 0712   GLUCOSEU Negative 11/10/2013 2124   HGBUR 3+* 02/15/2015 0712   HGBUR Negative 11/10/2013 2124   BILIRUBINUR NEGATIVE 02/15/2015 0712   BILIRUBINUR Negative 11/10/2013 2124   KETONESUR NEGATIVE 02/15/2015 0712    KETONESUR Negative 11/10/2013 2124   PROTEINUR NEGATIVE 02/15/2015 0712   PROTEINUR Negative 11/10/2013 2124   NITRITE NEGATIVE 02/15/2015 0712   NITRITE Negative 11/10/2013 2124   LEUKOCYTESUR NEGATIVE 02/15/2015 0712   LEUKOCYTESUR Negative 11/10/2013 2124    Pertinent Imaging: CLINICAL DATA: Left flank pain, history of renal calculi, nephrolithiasis EXAM: ABDOMEN - 1 VIEW COMPARISON: None. FINDINGS: Sub cm small radiopaque calculi project over the right kidney shadow in the midpole region. These measure 6 mm or less in size. Left kidney is obscured by stool and gas pattern. Small subcentimeter pelvic calcifications, suspect venous phleboliths. Normal bowel gas pattern. No acute osseous finding.  IMPRESSION: Sub cm radiopaque right nephrolithiasis by plain radiography.  Assessment & Plan:    1. Flank pain-  5 day  history of left-sided flank pain. History of renal stones. Does have gross hematuria but otherwise no urinary symptoms. Patient instructed to continue [place] and push fluids. Patient instructed to seek immediate medical attention for uncontrolled pain, vomiting or fevers. - Urinalysis, Complete  2. Gross Hematuria- multiple episodes of gross hematuria over the last few weeks. Patient does have a history of renal stones and 20 year smoking history. We discussed the differential diagnosis for microscopic hematuriaincluding nephrolithiasis, renal or upper tract tumors, bladder stones, UTIs, or bladder tumors as well as undetermined etiologies. Per AUA guidelines, I did recommend complete microscopic hematuria evaluation including CTU, possible urine cytology, and office cystoscopy. Patient is in agreement with plan and we will proceed with CT urogram and cystoscopy for further evaluation of gross hematuria.  Return for schedule cystoscopy; appt for CT Urogram results.  These notes generated with voice recognition software. I apologize for typographical  errors.  Earlie Lou, FNP  [facility] [address], [address] [place], [place] [address] [phone]

## 2015-02-24 ENCOUNTER — Telehealth: Payer: Self-pay | Admitting: Radiology

## 2015-02-24 NOTE — Telephone Encounter (Signed)
LMOM notifying pt of CT appt 02/26/15 :15 at [facility]. NPO 4 hrs prior except drink 32oz water 30 mins prior to exam.

## 2015-02-25 NOTE — Telephone Encounter (Signed)
Pt states he needs to r/s CT appt. Pt advised to call Radiology Scheduling at [phone] to r/s. Advised to call back to r/s his f/u appt at the office once he knows when his CT will be performed. Pt verbalizes understanding.

## 2015-02-26 ENCOUNTER — Ambulatory Visit: Payer: Managed Care, Other (non HMO)

## 2015-02-27 ENCOUNTER — Emergency Department: Payer: Managed Care, Other (non HMO)

## 2015-02-27 ENCOUNTER — Encounter: Payer: Self-pay | Admitting: *Deleted

## 2015-02-27 ENCOUNTER — Emergency Department
Admission: EM | Admit: 2015-02-27 | Discharge: 2015-02-27 | Disposition: A | Discharge status: Home / Self Care | Payer: Managed Care, Other (non HMO) | Attending: Emergency Medicine | Admitting: Emergency Medicine

## 2015-02-27 DIAGNOSIS — R109 Unspecified abdominal pain: Secondary | ICD-10-CM | POA: Diagnosis present

## 2015-02-27 DIAGNOSIS — Z87891 Personal history of nicotine dependence: Secondary | ICD-10-CM | POA: Diagnosis not present

## 2015-02-27 DIAGNOSIS — Z791 Long term (current) use of non-steroidal anti-inflammatories (NSAID): Secondary | ICD-10-CM | POA: Insufficient documentation

## 2015-02-27 DIAGNOSIS — Z79899 Other long term (current) drug therapy: Secondary | ICD-10-CM | POA: Diagnosis not present

## 2015-02-27 DIAGNOSIS — I10 Essential (primary) hypertension: Secondary | ICD-10-CM | POA: Insufficient documentation

## 2015-02-27 DIAGNOSIS — Z7982 Long term (current) use of aspirin: Secondary | ICD-10-CM | POA: Insufficient documentation

## 2015-02-27 DIAGNOSIS — N200 Calculus of kidney: Secondary | ICD-10-CM | POA: Diagnosis not present

## 2015-02-27 DIAGNOSIS — Z7902 Long term (current) use of antithrombotics/antiplatelets: Secondary | ICD-10-CM | POA: Insufficient documentation

## 2015-02-27 LAB — CBC WITH DIFFERENTIAL/PLATELET
Basophils Absolute: 0 10*3/uL (ref 0–0.1)
Basophils Relative: 0 %
EOS ABS: 0 10*3/uL (ref 0–0.7)
EOS PCT: 0 %
HCT: 45.2 % (ref 40.0–52.0)
Hemoglobin: 14.9 g/dL (ref 13.0–18.0)
LYMPHS ABS: 0.9 10*3/uL — AB (ref 1.0–3.6)
Lymphocytes Relative: 7 %
MCH: 29.6 pg (ref 26.0–34.0)
MCHC: 33.1 g/dL (ref 32.0–36.0)
MCV: 89.5 fL (ref 80.0–100.0)
MONOS PCT: 4 %
Monocytes Absolute: 0.5 10*3/uL (ref 0.2–1.0)
Neutro Abs: 11.4 10*3/uL — ABNORMAL HIGH (ref 1.4–6.5)
Neutrophils Relative %: 89 %
PLATELETS: 271 10*3/uL (ref 150–440)
RBC: 5.05 MIL/uL (ref 4.40–5.90)
RDW: 13.8 % (ref 11.5–14.5)
WBC: 12.8 10*3/uL — AB (ref 3.8–10.6)

## 2015-02-27 LAB — URINALYSIS COMPLETE WITH MICROSCOPIC (ARMC ONLY)
BACTERIA UA: NONE SEEN
Bilirubin Urine: NEGATIVE
GLUCOSE, UA: NEGATIVE mg/dL
LEUKOCYTES UA: NEGATIVE
NITRITE: NEGATIVE
PH: 7 (ref 5.0–8.0)
Protein, ur: NEGATIVE mg/dL
SPECIFIC GRAVITY, URINE: 1.019 (ref 1.005–1.030)
Squamous Epithelial / LPF: NONE SEEN

## 2015-02-27 LAB — BASIC METABOLIC PANEL
Anion gap: 12 (ref 5–15)
BUN: 12 mg/dL (ref 6–20)
CALCIUM: 10.2 mg/dL (ref 8.9–10.3)
CO2: 28 mmol/L (ref 22–32)
CREATININE: 1.43 mg/dL — AB (ref 0.61–1.24)
Chloride: 101 mmol/L (ref 101–111)
GFR calc Af Amer: >60 mL/min (ref >60)
GFR, EST NON AFRICAN AMERICAN: 53 mL/min — AB (ref >60)
Glucose, Bld: 109 mg/dL — ABNORMAL HIGH (ref 65–99)
Potassium: 4.5 mmol/L (ref 3.5–5.1)
SODIUM: 141 mmol/L (ref 135–145)

## 2015-02-27 MED ORDER — ONDANSETRON HCL 4 MG PO TABS: 4.0000 mg | ORAL_TABLET | Freq: Every day | ORAL | Status: DC | PRN

## 2015-02-27 MED ORDER — TAMSULOSIN HCL 0.4 MG PO CAPS: 0.4000 mg | ORAL_CAPSULE | Freq: Every day | ORAL | Status: DC

## 2015-02-27 MED ORDER — PROMETHAZINE HCL 25 MG RE SUPP: 25.0000 mg | Freq: Four times a day (QID) | RECTAL | Status: DC | PRN

## 2015-02-27 MED ORDER — FENTANYL CITRATE (PF) 100 MCG/2ML IJ SOLN
75.0000 ug | Freq: Once | INTRAMUSCULAR | Status: AC
  Administered 2015-02-27: 75 ug via INTRAVENOUS
  Filled 2015-02-27: qty 2

## 2015-02-27 MED ORDER — ONDANSETRON HCL 4 MG/2ML IJ SOLN
4.0000 mg | Freq: Once | INTRAMUSCULAR | Status: AC
  Administered 2015-02-27: 4 mg via INTRAVENOUS
  Filled 2015-02-27: qty 2

## 2015-02-27 MED ORDER — OXYCODONE-ACETAMINOPHEN 5-325 MG PO TABS: 1.0000 | ORAL_TABLET | ORAL | Status: DC | PRN

## 2015-02-27 NOTE — ED Notes (Signed)
Pt reports with left flank pain with vomiting. Hx of kidney stones.

## 2015-02-27 NOTE — ED Provider Notes (Signed)
[facility] Emergency Department Provider Note  ____________________________________________  Time seen: 1745  I have reviewed the triage vital signs and the nursing notes.   HISTORY  Chief Complaint Flank Pain and Emesis   History limited by: Not Limited   HPI Elijah [name] is a 59 y.o. male who presents to the emergency department today with concerns for left flank pain. He states that the pain started abruptly last night. He states the pain is been constant since then. He states that it is sharp and severe. It is primarily located in the flank without significant amount of pain coming up to the front. He has had associated nausea and multiple episodes of emesis. He has not had any dysuria. He states he does have a history of kidney stones was recently seen 2 weeks ago. At that time he is also having left sided pain however he stated they could not see the stone on an x-ray. He does have a ct scan ordered for Monday.   Past Medical History  Diagnosis Date  . Kidney stones   . Acid reflux   . Heart attack   . Hyperlipidemia     Patient Active Problem List   Diagnosis Date Noted  . 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 Episode of syncope 11/25/2013  . Dizziness 11/21/2013  . CCF (congestive cardiac failure) 11/21/2013  . Acid reflux 03/22/2012  . Essential (primary) hypertension 03/22/2012  . Arteriosclerosis of coronary artery 03/22/2012    Past Surgical History  Procedure Laterality Date  . Coronary stent placement    . Pacemaker insertion      Current Outpatient Rx  Name  Route  Sig  Dispense  Refill  . aspirin EC 81 MG tablet   Oral   Take 1 tablet by mouth daily.         Marland Kitchen atorvastatin (LIPITOR) 40 MG tablet   Oral   Take 1 tablet by mouth daily.         Marland Kitchen EFFIENT 10 MG TABS tablet   Oral   Take 1 tablet by mouth daily.           Dispense as written.   . naproxen (NAPROSYN) 500 MG tablet    Oral   Take 1 tablet (500 mg total) by mouth 2 (two) times daily with a meal.   20 tablet   0   . omeprazole (PRILOSEC OTC) 20 MG tablet   Oral   Take 20 mg by mouth daily.         . ondansetron (ZOFRAN ODT) 8 MG disintegrating tablet   Oral   Take 1 tablet (8 mg total) by mouth every 8 (eight) hours as needed for nausea or vomiting.   20 tablet   0   . oxyCODONE-acetaminophen (ROXICET) 5-325 MG per tablet   Oral   Take 1 tablet by mouth every 6 (six) hours as needed for severe pain.   10 tablet   0   . RANEXA 1000 MG SR tablet   Oral   Take 1 tablet by mouth 2 (two) times daily.           Dispense as written.   Marland Kitchen spironolactone (ALDACTONE) 25 MG tablet   Oral   Take 1 tablet by mouth daily.         . tamsulosin (FLOMAX) 0.4 MG CAPS capsule   Oral   Take 1 capsule (0.4 mg total) by mouth daily.   30 capsule  0   . vitamin B-12 (CYANOCOBALAMIN) 1000 MCG tablet   Oral   Take 1 tablet by mouth daily.           Allergies Hydrocodone-acetaminophen; Morphine; and Sulfa antibiotics  Family History  Problem Relation Age of Onset  . Urolithiasis Neg Hx   . Prostate cancer Neg Hx   . Kidney disease Neg Hx   . Kidney cancer Neg Hx     Social History Social History  Substance Use Topics  . Smoking status: Former Games developer  . Smokeless tobacco: None  . Alcohol Use: No    Review of Systems  Constitutional: Negative for fever. Cardiovascular: Negative for chest pain. Respiratory: Negative for shortness of breath. Gastrointestinal: left flank pain, positive for vomiting Genitourinary: Negative for dysuria. Musculoskeletal: Negative for back pain. Skin: Negative for rash. Neurological: Negative for headaches, focal weakness or numbness.  10-point ROS otherwise negative.  ____________________________________________   PHYSICAL EXAM:  VITAL SIGNS: ED Triage Vitals  Enc Vitals Group     BP 02/27/15 1616 139/76 mmHg     Pulse Rate 02/27/15 1616 75      Resp 02/27/15 1616 18     Temp 02/27/15 1616 98.1 F (36.7 C)     Temp Source 02/27/15 1616 Oral     SpO2 02/27/15 1616 97 %     Weight 02/27/15 1616 186 lb (84.369 kg)     Height 02/27/15 1616  (1.727 m)     Head Cir --      Peak Flow --      Pain Score 02/27/15 1613 7   Constitutional: Alert and oriented. Appears uncomfortable. Actively vomiting.  Eyes: Conjunctivae are normal. PERRL. Normal extraocular movements. ENT   Head: Normocephalic and atraumatic.   Nose: No congestion/rhinnorhea.   Mouth/Throat: Mucous membranes are moist.   Neck: No stridor. Hematological/Lymphatic/Immunilogical: No cervical lymphadenopathy. Cardiovascular: Normal rate, regular rhythm.  No murmurs, rubs, or gallops. Respiratory: Normal respiratory effort without tachypnea nor retractions. Breath sounds are clear and equal bilaterally. No wheezes/rales/rhonchi. Gastrointestinal: Soft and nontender. No distention. Genitourinary: Deferred Musculoskeletal: Normal range of motion in all extremities. No joint effusions.  No lower extremity tenderness nor edema. Neurologic:  Normal speech and language. No gross focal neurologic deficits are appreciated. Speech is normal.  Skin:  Skin is warm, dry and intact. No rash noted. Psychiatric: Mood and affect are normal. Speech and behavior are normal. Patient exhibits appropriate insight and judgment.  ____________________________________________    LABS (pertinent positives/negatives)  Labs Reviewed  URINALYSIS COMPLETEWITH MICROSCOPIC ([facility] ONLY) - Abnormal; Notable for the following:    Color, Urine YELLOW (*)    APPearance CLEAR (*)    Ketones, ur 1+ (*)    Hgb urine dipstick 1+ (*)    All other components within normal limits  CBC WITH DIFFERENTIAL/PLATELET - Abnormal; Notable for the following:    WBC 12.8 (*)    Neutro Abs 11.4 (*)    Lymphs Abs 0.9 (*)    All other components within normal limits  BASIC METABOLIC PANEL -  Abnormal; Notable for the following:    Glucose, Bld 109 (*)    Creatinine, Ser 1.43 (*)    GFR calc non Af Amer 53 (*)    All other components within normal limits     ____________________________________________   EKG  None  ____________________________________________    RADIOLOGY  CT renal study IMPRESSION: Obstructing 6 mm stone within the mid left ureter with resultant moderate left hydroureteronephrosis.  Multiple  additional nonobstructing stones within the right kidney.  I, GOODMAN, GRAYDON, personally viewed and evaluated these images (CT scan) as part of my medical decision making. ____________________________________________   PROCEDURES  Procedure(s) performed: None  Critical Care performed: No  ____________________________________________   INITIAL IMPRESSION / ASSESSMENT AND PLAN / ED COURSE  Pertinent labs & imaging results that were available during my care of the patient were reviewed by me and considered in my medical decision making (see chart for details).  Patient presented to the emergency department today concerned for left flank pain. CT scan does show a 6 mm obstructing stone in the left ureter. Mild leukocytosis in blood, no WBCs in the urine. Creatinine minimally bumped. Discussed with Dr. Berneice Heinrich with Urology who suggested conservative treatment at this time with trail of passage. Discussed this with patient. Will discharge home with flomax and antiemetics- pt states he has pain medication at home.  ____________________________________________   FINAL CLINICAL IMPRESSION(S) / ED DIAGNOSES  Final diagnoses:  Left flank pain  Kidney stone     Phineas Semen, MD 02/27/15 1945

## 2015-02-27 NOTE — Discharge Instructions (Signed)
Please follow up with your urologists. Please seek medical attention for any high fevers, chest pain, shortness of breath, change in behavior, persistent vomiting, bloody stool or any other new or concerning symptoms.   Kidney Stones Kidney stones (urolithiasis) are deposits that form inside your kidneys. The intense pain is caused by the stone moving through the urinary tract. When the stone moves, the ureter goes into spasm around the stone. The stone is usually passed in the urine.  CAUSES   A disorder that makes certain neck glands produce too much parathyroid hormone (primary hyperparathyroidism).  A buildup of uric acid crystals, similar to gout in your joints.  Narrowing (stricture) of the ureter.  A kidney obstruction present at birth (congenital obstruction).  Previous surgery on the kidney or ureters.  Numerous kidney infections. SYMPTOMS   Feeling sick to your stomach (nauseous).  Throwing up (vomiting).  Blood in the urine (hematuria).  Pain that usually spreads (radiates) to the groin.  Frequency or urgency of urination. DIAGNOSIS   Taking a history and physical exam.  Blood or urine tests.  CT scan.  Occasionally, an examination of the inside of the urinary bladder (cystoscopy) is performed. TREATMENT   Observation.  Increasing your fluid intake.  Extracorporeal shock wave lithotripsy--This is a noninvasive procedure that uses shock waves to break up kidney stones.  Surgery may be needed if you have severe pain or persistent obstruction. There are various surgical procedures. Most of the procedures are performed with the use of small instruments. Only small incisions are needed to accommodate these instruments, so recovery time is minimized. The size, location, and chemical composition are all important variables that will determine the proper choice of action for you. Talk to your health care provider to better understand your situation so that you will  minimize the risk of injury to yourself and your kidney.  HOME CARE INSTRUCTIONS   Drink enough water and fluids to keep your urine clear or pale yellow. This will help you to pass the stone or stone fragments.  Strain all urine through the provided strainer. Keep all particulate matter and stones for your health care provider to see. The stone causing the pain may be as small as a grain of salt. It is very important to use the strainer each and every time you pass your urine. The collection of your stone will allow your health care provider to analyze it and verify that a stone has actually passed. The stone analysis will often identify what you can do to reduce the incidence of recurrences.  Only take over-the-counter or prescription medicines for pain, discomfort, or fever as directed by your health care provider.  Make a follow-up appointment with your health care provider as directed.  Get follow-up X-rays if required. The absence of pain does not always mean that the stone has passed. It may have only stopped moving. If the urine remains completely obstructed, it can cause loss of kidney function or even complete destruction of the kidney. It is your responsibility to make sure X-rays and follow-ups are completed. Ultrasounds of the kidney can show blockages and the status of the kidney. Ultrasounds are not associated with any radiation and can be performed easily in a matter of minutes. SEEK MEDICAL CARE IF:  You experience pain that is progressive and unresponsive to any pain medicine you have been prescribed. SEEK IMMEDIATE MEDICAL CARE IF:   Pain cannot be controlled with the prescribed medicine.  You have a fever or shaking  chills.  The severity or intensity of pain increases over 18 hours and is not relieved by pain medicine.  You develop a new onset of abdominal pain.  You feel faint or pass out.  You are unable to urinate. MAKE SURE YOU:   Understand these  instructions.  Will watch your condition.  Will get help right away if you are not doing well or get worse. Document Released: 05/22/2005 Document Revised: 01/22/2013 Document Reviewed: 10/23/2012 [facility] Patient Information 2015 [place], [place]. This information is not intended to replace advice given to you by your health care provider. Make sure you discuss any questions you have with your health care provider.

## 2015-03-01 ENCOUNTER — Ambulatory Visit: Admission: RE | Admit: 2015-03-01 | Payer: Managed Care, Other (non HMO) | Source: Ambulatory Visit

## 2015-03-01 ENCOUNTER — Other Ambulatory Visit: Payer: Managed Care, Other (non HMO)

## 2015-03-02 ENCOUNTER — Ambulatory Visit (INDEPENDENT_AMBULATORY_CARE_PROVIDER_SITE_OTHER): Payer: Managed Care, Other (non HMO) | Admitting: Urology

## 2015-03-02 ENCOUNTER — Encounter: Payer: Self-pay | Admitting: Urology

## 2015-03-02 VITALS — BP 117/77 | HR 74 | Ht 68.0 in | Wt 180.5 lb

## 2015-03-02 DIAGNOSIS — N200 Calculus of kidney: Secondary | ICD-10-CM

## 2015-03-02 LAB — URINALYSIS, COMPLETE
BILIRUBIN UA: NEGATIVE
GLUCOSE, UA: NEGATIVE
KETONES UA: NEGATIVE
LEUKOCYTES UA: NEGATIVE
NITRITE UA: NEGATIVE
SPEC GRAV UA: 1.025 (ref 1.005–1.030)
Urobilinogen, Ur: 0.2 mg/dL (ref 0.2–1.0)
pH, UA: 6.5 (ref 5.0–7.5)

## 2015-03-02 LAB — MICROSCOPIC EXAMINATION
Bacteria, UA: NONE SEEN
Epithelial Cells (non renal): NONE SEEN /hpf (ref 0–10)

## 2015-03-02 MED ORDER — HYDROMORPHONE HCL 2 MG PO TABS
2.0000 mg | ORAL_TABLET | ORAL | Status: DC | PRN
Start: 2015-03-02 — End: 2015-03-16

## 2015-03-02 NOTE — Progress Notes (Signed)
 03/02/2015 3:39 PM   Elijah [name] 03/05/1956 [id number]  Referring provider: Kanhka Linthavong, MD [address] [place], [place] [address]  Chief Complaint  Patient presents with  . Nephrolithiasis    HPI:  1 - Recurrent Nephrolithiasis -  Pre 2016 - SWL x2, medical passage x3 02/2015 - Lt 5mm mid ureteral stone with mod hydro (5mm, 850HU, SSD 12cm) lateral to L4 transverse process by CT, Rt 3mm intra-renal x 3 non-obstructing  2 - Metabolic Stone Disease -  Eval 2016: BMP, PTH, Urate - pending; Composition - pending; 24 Hr Urines - pending  3 - Prostate Screening - No FHX prostate cancer 02/2015 DRE 40gm smooth/ PSA (nromal this year per report by PCP)  PMH sig for CAD/Stent (2013, no asymptomatic, on effient which he has come off for procedures, follows [facility]).   Today " Elijah [name] " is seen in f/u above. He is curtenly on trial of medical passage for 5mm left ureteral stone but still having sig discomfort req percocet and he cannot work while on them. He desires definitive management.    PMH: Past Medical History  Diagnosis Date  . Kidney stones   . Acid reflux   . Heart attack   . Hyperlipidemia     Surgical History: Past Surgical History  Procedure Laterality Date  . Coronary stent placement    . Pacemaker insertion      Home Medications:    Medication List       This list is accurate as of: 03/02/15  3:39 PM.  Always use your most recent med list.               aspirin EC 81 MG tablet  Take 1 tablet by mouth daily.     atorvastatin 40 MG tablet  Commonly known as:  LIPITOR  Take 1 tablet by mouth daily.     EFFIENT 10 MG Tabs tablet  Generic drug:  prasugrel  Take 1 tablet by mouth daily.     naproxen 500 MG tablet  Commonly known as:  NAPROSYN  Take 1 tablet (500 mg total) by mouth 2 (two) times daily with a meal.     omeprazole 20 MG tablet  Commonly known as:  PRILOSEC OTC  Take 20 mg by mouth daily.     ondansetron 4 MG  tablet  Commonly known as:  ZOFRAN  Take 1 tablet (4 mg total) by mouth daily as needed for nausea or vomiting.     ondansetron 8 MG disintegrating tablet  Commonly known as:  ZOFRAN ODT  Take 1 tablet (8 mg total) by mouth every 8 (eight) hours as needed for nausea or vomiting.     oxyCODONE-acetaminophen 5-325 MG tablet  Commonly known as:  ROXICET  Take 1 tablet by mouth every 4 (four) hours as needed for severe pain.     promethazine 25 MG suppository  Commonly known as:  PHENERGAN  Place 1 suppository (25 mg total) rectally every 6 (six) hours as needed for nausea.     RANEXA 1000 MG SR tablet  Generic drug:  ranolazine  Take 1 tablet by mouth 2 (two) times daily.     spironolactone 25 MG tablet  Commonly known as:  ALDACTONE  Take 1 tablet by mouth daily.     tamsulosin 0.4 MG Caps capsule  Commonly known as:  FLOMAX  Take 1 capsule (0.4 mg total) by mouth daily.     tamsulosin 0.4 MG Caps capsule  Commonly   known as:  FLOMAX  Take 1 capsule (0.4 mg total) by mouth daily.     vitamin B-12 1000 MCG tablet  Commonly known as:  CYANOCOBALAMIN  Take 1 tablet by mouth daily.        Allergies:  Allergies  Allergen Reactions  . Hydrocodone-Acetaminophen Nausea And Vomiting    Causes vomiting and GI upset  . Morphine Nausea And Vomiting    Causes vomiting and GI upset  . Sulfa Antibiotics Rash    Family History: Family History  Problem Relation Age of Onset  . Urolithiasis Neg Hx   . Prostate cancer Neg Hx   . Kidney disease Neg Hx   . Kidney cancer Neg Hx     Social History:  reports that he has quit smoking. He does not have any smokeless tobacco history on file. He reports that he does not drink alcohol or use illicit drugs.  ROS:   Review of Systems  Gastrointestinal (upper)  : Negative for upper GI symptoms  Gastrointestinal (lower) : Negative for lower GI symptoms  Constitutional : Negative for symptoms  Skin: Negative for skin  symptoms  Eyes: Negative for eye symptoms  Ear/Nose/Throat : Negative for Ear/Nose/Throat symptoms  Hematologic/Lymphatic: Negative for Hematologic/Lymphatic symptoms  Cardiovascular : Negative for cardiovascular symptoms  Respiratory : Negative for respiratory symptoms  Endocrine: Negative for endocrine symptoms  Musculoskeletal: Negative for musculoskeletal symptoms  Neurological: Negative for neurological symptoms  Psychologic: Negative for psychiatric symptoms      Physical Exam: There were no vitals taken for this visit.  Constitutional:  Alert and oriented, No acute distress. HEENT: [place] AT, moist mucus membranes.  Trachea midline, no masses. Cardiovascular: No clubbing, cyanosis, or edema. Respiratory: Normal respiratory effort, no increased work of breathing. GI: Abdomen is soft, nontender, nondistended, no abdominal masses GU: No CVA tenderness. Mild Left CVAT. Phallus straight. No palpable testes masses / hernias. DRE 40gm smooth.  Skin: No rashes, bruises or suspicious lesions. Lymph: No cervical or inguinal adenopathy. Neurologic: Grossly intact, no focal deficits, moving all 4 extremities. Psychiatric: Normal mood and affect.  Laboratory Data: Lab Results  Component Value Date   WBC 12.8* 02/27/2015   HGB 14.9 02/27/2015   HCT 45.2 02/27/2015   MCV 89.5 02/27/2015   PLT 271 02/27/2015    Lab Results  Component Value Date   CREATININE 1.43* 02/27/2015    No results found for: PSA  No results found for: TESTOSTERONE  No results found for: HGBA1C  Urinalysis    Component Value Date/Time   COLORURINE YELLOW* 02/27/2015 1617   COLORURINE Yellow 11/10/2013 2124   APPEARANCEUR CLEAR* 02/27/2015 1617   APPEARANCEUR Clear 11/10/2013 2124   LABSPEC 1.019 02/27/2015 1617   LABSPEC 1.024 11/10/2013 2124   PHURINE 7.0 02/27/2015 1617   PHURINE 5.0 11/10/2013 2124   GLUCOSEU NEGATIVE 02/27/2015 1617   GLUCOSEU Negative 11/10/2013 2124    HGBUR 1+* 02/27/2015 1617   HGBUR Negative 11/10/2013 2124   BILIRUBINUR NEGATIVE 02/27/2015 1617   BILIRUBINUR Negative 02/19/2015 1523   BILIRUBINUR Negative 11/10/2013 2124   KETONESUR 1+* 02/27/2015 1617   KETONESUR Negative 11/10/2013 2124   PROTEINUR NEGATIVE 02/27/2015 1617   PROTEINUR Negative 11/10/2013 2124   NITRITE NEGATIVE 02/27/2015 1617   NITRITE Negative 02/19/2015 1523   NITRITE Negative 11/10/2013 2124   LEUKOCYTESUR NEGATIVE 02/27/2015 1617   LEUKOCYTESUR 1+* 02/19/2015 1523   LEUKOCYTESUR Negative 11/10/2013 2124     Assessment & Plan:   1 - Recurrent Nephrolithiasis - left   ureteral stone symptomatic. Discussed MET, SWL, URS and adamantly wants to proceed with left URS next avail. I agree. Will change to PO dilaudid 2mg for interval. Warned to contact MD for refracotry sympotms or new fevers. He will stop effient 2-3 days prior to ureteroscopy as he has done previously now that >2years from cardiac sent.  2 - Metabolic Stone Disease - rec complete eval given multifocality and recurrence pattern.   BMP,PTH,Urate today, composition and 24 hr urines to follow  3 - Prostate Screening - DRE today nomral. Up to date this year.     No Follow-up on file.  Ebone Alcivar, MD  [facility] [address], [address] [place], [place] [address] [phone]    

## 2015-03-02 NOTE — Telephone Encounter (Signed)
Pt was seen in ED. F/u appt made for 03/02/15.

## 2015-03-03 ENCOUNTER — Telehealth: Payer: Self-pay | Admitting: Urology

## 2015-03-03 LAB — BASIC METABOLIC PANEL
BUN/Creatinine Ratio: 8 — ABNORMAL LOW (ref 9–20)
BUN: 15 mg/dL (ref 6–24)
CALCIUM: 9.5 mg/dL (ref 8.7–10.2)
CHLORIDE: 96 mmol/L — AB (ref 97–108)
CO2: 27 mmol/L (ref 18–29)
Creatinine, Ser: 1.84 mg/dL — ABNORMAL HIGH (ref 0.76–1.27)
GFR calc Af Amer: 46 mL/min/{1.73_m2} — ABNORMAL LOW (ref >59)
GFR calc non Af Amer: 40 mL/min/{1.73_m2} — ABNORMAL LOW (ref >59)
GLUCOSE: 87 mg/dL (ref 65–99)
POTASSIUM: 4.7 mmol/L (ref 3.5–5.2)
Sodium: 138 mmol/L (ref 134–144)

## 2015-03-03 LAB — PTH, INTACT AND CALCIUM: PTH: 36 pg/mL (ref 15–65)

## 2015-03-03 LAB — URIC ACID: URIC ACID: 8.4 mg/dL (ref 3.7–8.6)

## 2015-03-03 NOTE — Telephone Encounter (Signed)
Patient notified of results/Sw

## 2015-03-03 NOTE — Telephone Encounter (Signed)
-----   Message from Sebastian Ache, MD sent at 03/03/2015 11:23 AM EDT ----- Regarding: results Please let [name] know that recent bloodwork shows minerals in good balance, but kidney function shows signs of strain (from current known ureteral stone) that should improve after stone removed.

## 2015-03-04 ENCOUNTER — Telehealth: Payer: Self-pay | Admitting: Urology

## 2015-03-04 NOTE — Telephone Encounter (Signed)
Spoke with patient and explained that our surgery dates are full until 03-16-15, if there are any cancellations we will contact him for a sooner date. If he needs a refill on pain medication he will contact us/SW

## 2015-03-04 NOTE — Telephone Encounter (Signed)
Patient called this morning and is requesting a call back from Dr. Berneice Heinrich.  He is scheduled for surgery 03/16/2015 and he does not feel that he can wait that long.

## 2015-03-05 ENCOUNTER — Telehealth: Payer: Self-pay | Admitting: Radiology

## 2015-03-05 NOTE — Telephone Encounter (Signed)
Pt notified of surgery scheduled 03/16/15, pre-admit testing appt 03/09/15 :00 and to call day prior to surgery for arrival time to SDS. Pt advised to be npo after mn day of surgery. Pt verbalizes understanding.

## 2015-03-09 ENCOUNTER — Other Ambulatory Visit: Payer: Managed Care, Other (non HMO)

## 2015-03-09 ENCOUNTER — Encounter
Admission: RE | Admit: 2015-03-09 | Discharge: 2015-03-09 | Disposition: A | Discharge status: Home / Self Care | Payer: Managed Care, Other (non HMO) | Source: Ambulatory Visit | Attending: Urology | Admitting: Urology

## 2015-03-09 ENCOUNTER — Telehealth: Payer: Self-pay | Admitting: Radiology

## 2015-03-09 DIAGNOSIS — N200 Calculus of kidney: Secondary | ICD-10-CM | POA: Insufficient documentation

## 2015-03-09 DIAGNOSIS — Z01818 Encounter for other preprocedural examination: Secondary | ICD-10-CM | POA: Diagnosis not present

## 2015-03-09 DIAGNOSIS — I2510 Atherosclerotic heart disease of native coronary artery without angina pectoris: Secondary | ICD-10-CM | POA: Diagnosis not present

## 2015-03-09 HISTORY — DX: Reserved for inherently not codable concepts without codable children: IMO0001

## 2015-03-09 HISTORY — DX: Heart failure, unspecified: I50.9

## 2015-03-09 HISTORY — DX: Anemia, unspecified: D64.9

## 2015-03-09 HISTORY — DX: Anxiety disorder, unspecified: F41.9

## 2015-03-09 HISTORY — DX: Personal history of other mental and behavioral disorders: Z86.59

## 2015-03-09 HISTORY — DX: Presence of cardiac pacemaker: Z95.0

## 2015-03-09 HISTORY — DX: Restless legs syndrome: G25.81

## 2015-03-09 HISTORY — DX: Atherosclerotic heart disease of native coronary artery without angina pectoris: I25.10

## 2015-03-09 HISTORY — DX: Angina pectoris, unspecified: I20.9

## 2015-03-09 LAB — CBC
HCT: 40.7 % (ref 40.0–52.0)
Hemoglobin: 13.7 g/dL (ref 13.0–18.0)
MCH: 29.6 pg (ref 26.0–34.0)
MCHC: 33.5 g/dL (ref 32.0–36.0)
MCV: 88.5 fL (ref 80.0–100.0)
PLATELETS: 272 10*3/uL (ref 150–440)
RBC: 4.61 MIL/uL (ref 4.40–5.90)
RDW: 13.3 % (ref 11.5–14.5)
WBC: 7.9 10*3/uL (ref 3.8–10.6)

## 2015-03-09 LAB — DIFFERENTIAL
BASOS ABS: 0 10*3/uL (ref 0–0.1)
BASOS PCT: 0 %
EOS ABS: 0.1 10*3/uL (ref 0–0.7)
Eosinophils Relative: 1 %
Lymphocytes Relative: 24 %
Lymphs Abs: 1.9 10*3/uL (ref 1.0–3.6)
MONOS PCT: 10 %
Monocytes Absolute: 0.8 10*3/uL (ref 0.2–1.0)
NEUTROS ABS: 5.1 10*3/uL (ref 1.4–6.5)
Neutrophils Relative %: 65 %

## 2015-03-09 LAB — BASIC METABOLIC PANEL WITH GFR
Anion gap: 9 (ref 5–15)
BUN: 21 mg/dL — ABNORMAL HIGH (ref 6–20)
CO2: 27 mmol/L (ref 22–32)
Calcium: 9.6 mg/dL (ref 8.9–10.3)
Chloride: 102 mmol/L (ref 101–111)
Creatinine, Ser: 1.89 mg/dL — ABNORMAL HIGH (ref 0.61–1.24)
GFR calc Af Amer: 43 mL/min — ABNORMAL LOW
GFR calc non Af Amer: 38 mL/min — ABNORMAL LOW
Glucose, Bld: 96 mg/dL (ref 65–99)
Potassium: 4.4 mmol/L (ref 3.5–5.1)
Sodium: 138 mmol/L (ref 135–145)

## 2015-03-09 NOTE — Telephone Encounter (Signed)
Pt called stating he was told in pre-admit testing that he should hold Effient and ASA 81 mg prior to surgery. States Dr Berneice Heinrich told him to hold the Effient for 2 days prior to surgery. Per Dr Berneice Heinrich pt does not have to hold the ASA but can hold the Effient for 2 days prior. Pt voices understanding.

## 2015-03-09 NOTE — Patient Instructions (Signed)
  Your procedure is scheduled on: March 16, 2015 (Tuesday) Report to Day Surgery.[facility]) To find out your arrival time please call [phone] between 1PM - 3PM on March 15, 2015 (Monday).  Remember: Instructions that are not followed completely may result in serious medical risk, up to and including death, or upon the discretion of your surgeon and anesthesiologist your surgery may need to be rescheduled.    __x__ 1. Do not eat food or drink liquids after midnight. No gum chewing or hard candies.     ____ 2. No Alcohol for 24 hours before or after surgery.   ____ 3. Bring all medications with you on the day of surgery if instructed.    __x__ 4. Notify your doctor if there is any change in your medical condition     (cold, fever, infections).     Do not wear jewelry, make-up, hairpins, clips or nail polish.  Do not wear lotions, powders, or perfumes. You may wear deodorant.  Do not shave 48 hours prior to surgery. Men may shave face and neck.  Do not bring valuables to the hospital.    [facility] is not responsible for any belongings or valuables.               Contacts, dentures or bridgework may not be worn into surgery.  Leave your suitcase in the car. After surgery it may be brought to your room.  For patients admitted to the hospital, discharge time is determined by your                treatment team.   Patients discharged the day of surgery will not be allowed to drive home.   Please read over the following fact sheets that you were given:      _x___ Take these medicines the morning of surgery with A SIP OF WATER:    1. Omeprazole (Omeprazole at bedtime on Monday night)  2. Ranexa  3.   4.  5.  6.  ____ Fleet Enema (as directed)   ____ Use CHG Soap as directed  ____ Use inhalers on the day of surgery  ____ Stop metformin 2 days prior to surgery    ____ Take 1/2 of usual insulin dose the night before surgery and none on the morning of surgery.    _x_ Stop Coumadin/Plavix/aspirin on (Check with Dr. Marlou Porch office concerning Aspirin, stop Effient as instructed by Dr. Unknown Foley)  __x__ Stop Anti-inflammatories on (Stop Naproxen 7-10 days prior  to surgery)   __x__ Stop supplements until after surgery.  (Stop Vitamin B-12 now until after surgery ) ____ Bring C-Pap to the hospital.

## 2015-03-16 ENCOUNTER — Encounter: Payer: Self-pay | Admitting: Urology

## 2015-03-16 ENCOUNTER — Ambulatory Visit: Payer: Managed Care, Other (non HMO) | Admitting: Certified Registered Nurse Anesthetist

## 2015-03-16 ENCOUNTER — Encounter
Admission: RE | Disposition: A | Discharge status: Home / Self Care | Payer: Self-pay | Source: Ambulatory Visit | Attending: Urology

## 2015-03-16 ENCOUNTER — Other Ambulatory Visit: Payer: Self-pay

## 2015-03-16 ENCOUNTER — Ambulatory Visit
Admission: RE | Admit: 2015-03-16 | Discharge: 2015-03-16 | Disposition: A | Discharge status: Home / Self Care | Payer: Managed Care, Other (non HMO) | Source: Ambulatory Visit | Attending: Urology | Admitting: Urology

## 2015-03-16 DIAGNOSIS — Z885 Allergy status to narcotic agent status: Secondary | ICD-10-CM | POA: Insufficient documentation

## 2015-03-16 DIAGNOSIS — N132 Hydronephrosis with renal and ureteral calculous obstruction: Secondary | ICD-10-CM | POA: Diagnosis not present

## 2015-03-16 DIAGNOSIS — K219 Gastro-esophageal reflux disease without esophagitis: Secondary | ICD-10-CM | POA: Diagnosis not present

## 2015-03-16 DIAGNOSIS — E785 Hyperlipidemia, unspecified: Secondary | ICD-10-CM | POA: Diagnosis not present

## 2015-03-16 DIAGNOSIS — Z7982 Long term (current) use of aspirin: Secondary | ICD-10-CM | POA: Insufficient documentation

## 2015-03-16 DIAGNOSIS — Z955 Presence of coronary angioplasty implant and graft: Secondary | ICD-10-CM | POA: Insufficient documentation

## 2015-03-16 DIAGNOSIS — I252 Old myocardial infarction: Secondary | ICD-10-CM | POA: Insufficient documentation

## 2015-03-16 DIAGNOSIS — F419 Anxiety disorder, unspecified: Secondary | ICD-10-CM | POA: Diagnosis not present

## 2015-03-16 DIAGNOSIS — I2510 Atherosclerotic heart disease of native coronary artery without angina pectoris: Secondary | ICD-10-CM | POA: Insufficient documentation

## 2015-03-16 DIAGNOSIS — Z950 Presence of cardiac pacemaker: Secondary | ICD-10-CM | POA: Diagnosis not present

## 2015-03-16 DIAGNOSIS — Z882 Allergy status to sulfonamides status: Secondary | ICD-10-CM | POA: Insufficient documentation

## 2015-03-16 DIAGNOSIS — N200 Calculus of kidney: Secondary | ICD-10-CM

## 2015-03-16 DIAGNOSIS — Z87891 Personal history of nicotine dependence: Secondary | ICD-10-CM | POA: Diagnosis not present

## 2015-03-16 DIAGNOSIS — N201 Calculus of ureter: Secondary | ICD-10-CM | POA: Diagnosis not present

## 2015-03-16 DIAGNOSIS — D649 Anemia, unspecified: Secondary | ICD-10-CM | POA: Insufficient documentation

## 2015-03-16 DIAGNOSIS — Z87442 Personal history of urinary calculi: Secondary | ICD-10-CM | POA: Diagnosis not present

## 2015-03-16 DIAGNOSIS — Z79899 Other long term (current) drug therapy: Secondary | ICD-10-CM | POA: Insufficient documentation

## 2015-03-16 DIAGNOSIS — I10 Essential (primary) hypertension: Secondary | ICD-10-CM | POA: Insufficient documentation

## 2015-03-16 HISTORY — PX: CYSTOSCOPY W/ RETROGRADES: SHX1426

## 2015-03-16 HISTORY — PX: CYSTOSCOPY/URETEROSCOPY/HOLMIUM LASER/STENT PLACEMENT: SHX6546

## 2015-03-16 HISTORY — PX: CYSTOSCOPY WITH STENT PLACEMENT: SHX5790

## 2015-03-16 SURGERY — CYSTOSCOPY/URETEROSCOPY/HOLMIUM LASER/STENT PLACEMENT
Anesthesia: General | Laterality: Left | Wound class: Clean Contaminated

## 2015-03-16 MED ORDER — ONDANSETRON HCL 4 MG/2ML IJ SOLN
INTRAMUSCULAR | Status: DC | PRN
  Administered 2015-03-16: 4 mg via INTRAVENOUS

## 2015-03-16 MED ORDER — LIDOCAINE HCL (CARDIAC) 20 MG/ML IV SOLN
INTRAVENOUS | Status: DC | PRN
  Administered 2015-03-16: 100 mg via INTRAVENOUS

## 2015-03-16 MED ORDER — ONDANSETRON HCL 4 MG/2ML IJ SOLN: 4.0000 mg | Freq: Once | INTRAMUSCULAR | Status: DC | PRN

## 2015-03-16 MED ORDER — KETOROLAC TROMETHAMINE 30 MG/ML IJ SOLN
INTRAMUSCULAR | Status: DC | PRN
  Administered 2015-03-16: 30 mg via INTRAVENOUS

## 2015-03-16 MED ORDER — GLYCOPYRROLATE 0.2 MG/ML IJ SOLN
INTRAMUSCULAR | Status: DC | PRN
  Administered 2015-03-16: 0.2 mg via INTRAVENOUS

## 2015-03-16 MED ORDER — ACETAMINOPHEN-CODEINE #3 300-30 MG PO TABS: 1.0000 | ORAL_TABLET | ORAL | Status: DC | PRN

## 2015-03-16 MED ORDER — DEXTROSE 5 % IV SOLN
1.0000 g | Freq: Once | INTRAVENOUS | Status: AC
  Administered 2015-03-16: 1 g via INTRAVENOUS
  Filled 2015-03-16: qty 10

## 2015-03-16 MED ORDER — SODIUM CHLORIDE 0.9 % IV SOLN
INTRAVENOUS | Status: DC
Start: 2015-03-16 — End: 2015-03-16
  Administered 2015-03-16: 08:00:00 via INTRAVENOUS

## 2015-03-16 MED ORDER — MIDAZOLAM HCL 2 MG/2ML IJ SOLN
INTRAMUSCULAR | Status: DC | PRN
  Administered 2015-03-16: 1 mg via INTRAVENOUS

## 2015-03-16 MED ORDER — FENTANYL CITRATE (PF) 100 MCG/2ML IJ SOLN
INTRAMUSCULAR | Status: DC | PRN
  Administered 2015-03-16: 150 ug via INTRAVENOUS

## 2015-03-16 MED ORDER — LIDOCAINE HCL 2 % EX GEL
CUTANEOUS | Status: DC | PRN
  Administered 2015-03-16: 1

## 2015-03-16 MED ORDER — FENTANYL CITRATE (PF) 100 MCG/2ML IJ SOLN: 25.0000 ug | INTRAMUSCULAR | Status: DC | PRN

## 2015-03-16 MED ORDER — BELLADONNA ALKALOIDS-OPIUM 16.2-60 MG RE SUPP
RECTAL | Status: DC | PRN
  Administered 2015-03-16: 1 via RECTAL

## 2015-03-16 MED ORDER — METOPROLOL TARTRATE 1 MG/ML IV SOLN
INTRAVENOUS | Status: DC | PRN
  Administered 2015-03-16: 2 mg via INTRAVENOUS

## 2015-03-16 MED ORDER — BELLADONNA ALKALOIDS-OPIUM 16.2-60 MG RE SUPP
RECTAL | Status: AC
  Filled 2015-03-16: qty 1

## 2015-03-16 MED ORDER — PROPOFOL 10 MG/ML IV BOLUS
INTRAVENOUS | Status: DC | PRN
  Administered 2015-03-16: 100 mg via INTRAVENOUS

## 2015-03-16 MED ORDER — SODIUM CHLORIDE 0.9 % IR SOLN
Status: DC | PRN
  Administered 2015-03-16: 3000 mL

## 2015-03-16 MED ORDER — CIPROFLOXACIN HCL 500 MG PO TABS: 500.0000 mg | ORAL_TABLET | Freq: Once | ORAL | Status: DC

## 2015-03-16 MED ORDER — LIDOCAINE HCL 2 % EX GEL
CUTANEOUS | Status: AC
  Filled 2015-03-16: qty 10

## 2015-03-16 MED ORDER — IOTHALAMATE MEGLUMINE 43 % IV SOLN
INTRAVENOUS | Status: DC | PRN
  Administered 2015-03-16: 50 mL

## 2015-03-16 SURGICAL SUPPLY — 46 items
BAG DRAIN CYSTO-URO LG1000N (MISCELLANEOUS) ×3 IMPLANT
BALLN URETL DIL 7X10 (BALLOONS) ×3
BALLOON URETL DIL 7X10 (BALLOONS) ×1 IMPLANT
BASKET ZERO TIP 1.9FR (BASKET) ×3 IMPLANT
CATH FOL LX CONE TIP  8F (CATHETERS)
CATH FOL LX CONE TIP 8F (CATHETERS) IMPLANT
CATH URETL 5X70 OPEN END (CATHETERS) ×3 IMPLANT
CATH URETL OPEN END 6X70 (CATHETERS) IMPLANT
CNTNR SPEC 2.5X3XGRAD LEK (MISCELLANEOUS) ×1
CONRAY 43 FOR UROLOGY 50M (MISCELLANEOUS) ×3 IMPLANT
CONT SPEC 4OZ STER OR WHT (MISCELLANEOUS) ×2
CONTAINER SPEC 2.5X3XGRAD LEK (MISCELLANEOUS) ×1 IMPLANT
DRSG TEGADERM 2-3/8X2-3/4 SM (GAUZE/BANDAGES/DRESSINGS) ×3 IMPLANT
FEE TECHNICIAN ONLY PER HOUR (MISCELLANEOUS) IMPLANT
GLOVE BIO SURGEON STRL SZ7 (GLOVE) ×6 IMPLANT
GLOVE BIO SURGEON STRL SZ7.5 (GLOVE) ×3 IMPLANT
GOWN L4 LG 24 PK N/S (GOWN DISPOSABLE) ×3 IMPLANT
GOWN STRL REUS W/ TWL LRG LVL3 (GOWN DISPOSABLE) ×1 IMPLANT
GOWN STRL REUS W/TWL LRG LVL3 (GOWN DISPOSABLE) ×2
GOWN STRL REUS W/TWL XL LVL3 (GOWN DISPOSABLE) ×3 IMPLANT
GUIDEWIRE ANG ZIPWIRE 035X150 (WIRE) IMPLANT
INTRODUCER DILATOR DOUBLE (INTRODUCER) IMPLANT
KIT BALLN UROMAX 15FX4 (MISCELLANEOUS) ×1 IMPLANT
KIT BALLN UROMAX 26 75X4 (MISCELLANEOUS) ×2
KIT RM TURNOVER CYSTO AR (KITS) ×3 IMPLANT
LASER HOLMIUM FIBER SU 272UM (MISCELLANEOUS) IMPLANT
LASER HOLMIUM FIBER SU 550UM (MISCELLANEOUS) ×3 IMPLANT
LASER HOLMIUM SU 200UM (MISCELLANEOUS) ×3 IMPLANT
PACK CYSTO AR (MISCELLANEOUS) ×3 IMPLANT
PREP PVP WINGED SPONGE (MISCELLANEOUS) ×3 IMPLANT
SENSORWIRE 0.038 NOT ANGLED (WIRE) ×6
SET CYSTO W/LG BORE CLAMP LF (SET/KITS/TRAYS/PACK) ×3 IMPLANT
SHEATH URETERAL 12FR 45CM (SHEATH) ×3 IMPLANT
SOL .9 NS 3000ML IRR  AL (IV SOLUTION) ×2
SOL .9 NS 3000ML IRR UROMATIC (IV SOLUTION) ×1 IMPLANT
SOL PREP PVP 2OZ (MISCELLANEOUS) ×3
SOLUTION PREP PVP 2OZ (MISCELLANEOUS) ×1 IMPLANT
STENT URET 6FRX24 CONTOUR (STENTS) IMPLANT
STENT URET 6FRX26 CONTOUR (STENTS) ×3 IMPLANT
SURGILUBE 2OZ TUBE FLIPTOP (MISCELLANEOUS) ×3 IMPLANT
SWABSTK COMLB BENZOIN TINCTURE (MISCELLANEOUS) ×3 IMPLANT
SYRINGE IRR TOOMEY STRL 70CC (SYRINGE) ×3 IMPLANT
TUBING CONNECTING 10 (TUBING) ×2 IMPLANT
TUBING CONNECTING 10' (TUBING) ×1
WATER STERILE IRR 1000ML POUR (IV SOLUTION) ×3 IMPLANT
WIRE SENSOR 0.038 NOT ANGLED (WIRE) ×2 IMPLANT

## 2015-03-16 NOTE — Interval H&P Note (Signed)
History and Physical Interval Note: Has not passed his stone. AFVSS RRR CTA-B  Proceed with planned surgery. 03/16/2015 8:53 AM  Elijah [name]  has presented today for surgery, with the diagnosis of LEFT NEPHROLITHIASIS  The various methods of treatment have been discussed with the patient and family. After consideration of risks, benefits and other options for treatment, the patient has consented to  Procedure(s): CYSTOSCOPY/URETEROSCOPY/HOLMIUM LASER/STENT PLACEMENT (Left) CYSTOSCOPY WITH STENT PLACEMENT (Left) CYSTOSCOPY WITH RETROGRADE PYELOGRAM (Left) as a surgical intervention .  The patient's history has been reviewed, patient examined, no change in status, stable for surgery.  I have reviewed the patient's chart and labs.  Questions were answered to the patient's satisfaction.     Berniece Salines W

## 2015-03-16 NOTE — Transfer of Care (Signed)
Immediate Anesthesia Transfer of Care Note  Patient: Elijah [name]  Procedure(s) Performed: Procedure(s): CYSTOSCOPY/URETEROSCOPY/HOLMIUM LASER/STENT PLACEMENT (Left) CYSTOSCOPY WITH STENT PLACEMENT (Left) CYSTOSCOPY WITH RETROGRADE PYELOGRAM (Left)  Patient Location: PACU  Anesthesia Type:General  Level of Consciousness: sedated  Airway & Oxygen Therapy: Patient Spontanous Breathing and Patient connected to nasal cannula oxygen  Post-op Assessment: Report given to RN and Post -op Vital signs reviewed and stable  Post vital signs: Reviewed and stable  Last Vitals:  Filed Vitals:   03/16/15 1112  BP: 129/92  Pulse: 88  Temp: 36.9 C  Resp: 16    Complications: No apparent anesthesia complications

## 2015-03-16 NOTE — Anesthesia Preprocedure Evaluation (Signed)
Anesthesia Evaluation  Patient identified by MRN, date of birth, ID band Patient awake    Reviewed: Allergy & Precautions, NPO status , Patient's Chart, lab work & pertinent test results  History of Anesthesia Complications Negative for: history of anesthetic complications  Airway Mallampati: II       Dental  (+) Partial Upper   Pulmonary former smoker,     + decreased breath sounds      Cardiovascular hypertension, + CAD and + Past MI  Normal cardiovascular exam+ pacemaker      Neuro/Psych Anxiety    GI/Hepatic Neg liver ROS, GERD  Medicated,  Endo/Other  negative endocrine ROS  Renal/GU Renal disease     Musculoskeletal   Abdominal Normal abdominal exam  (+)   Peds  Hematology  (+) anemia ,   Anesthesia Other Findings   Reproductive/Obstetrics                             Anesthesia Physical Anesthesia Plan  ASA: III  Anesthesia Plan: General   Post-op Pain Management:    Induction: Intravenous  Airway Management Planned: LMA  Additional Equipment:   Intra-op Plan:   Post-operative Plan: Extubation in OR  Informed Consent: I have reviewed the patients History and Physical, chart, labs and discussed the procedure including the risks, benefits and alternatives for the proposed anesthesia with the patient or authorized representative who has indicated his/her understanding and acceptance.     Plan Discussed with: CRNA  Anesthesia Plan Comments:         Anesthesia Quick Evaluation

## 2015-03-16 NOTE — Anesthesia Postprocedure Evaluation (Signed)
  Anesthesia Post-op Note  Patient: Elijah [name]  Procedure(s) Performed: Procedure(s): CYSTOSCOPY/URETEROSCOPY/HOLMIUM LASER/STENT PLACEMENT (Left) CYSTOSCOPY WITH STENT PLACEMENT (Left) CYSTOSCOPY WITH RETROGRADE PYELOGRAM (Left)  Anesthesia type:General  Patient location: PACU  Post pain: Pain level controlled  Post assessment: Post-op Vital signs reviewed, Patient's Cardiovascular Status Stable, Respiratory Function Stable, Patent Airway and No signs of Nausea or vomiting  Post vital signs: Reviewed and stable  Last Vitals:  Filed Vitals:   03/16/15 1151  BP: 149/90  Pulse: 71  Temp: 36.1 C  Resp: 16    Level of consciousness: awake, alert  and patient cooperative  Complications: No apparent anesthesia complications

## 2015-03-16 NOTE — Discharge Instructions (Signed)
DISCHARGE INSTRUCTIONS FOR KIDNEY STONE/URETERAL STENT   MEDICATIONS:  1.  Resume all your other meds from home - except do not take any extra narcotic pain meds that you may have at home.  2. Tylenol with codeine is for moderate/severe pain, otherwise taking upto  every 6 hours of plainTylenol will help treat your pain.  Do not take both at the same time. 3. Take Cipro one hour prior to removal of your stent.   ACTIVITY:  1. No strenuous activity x 1week  2. No driving while on narcotic pain medications  3. Drink plenty of water  4. Continue to walk at home - you can still get blood clots when you are at home, so keep active, but don't over do it.  5. May return to work/school tomorrow or when you feel ready   BATHING:  1. You can shower and we recommend daily showers  2. You have a string coming from your urethra: The stent string is attached to your ureteral stent. Do not pull on this.   SIGNS/SYMPTOMS TO CALL:  Please call us if you have a fever greater than 101.5, uncontrolled nausea/vomiting, uncontrolled pain, dizziness, unable to urinate, bloody urine, chest pain, shortness of breath, leg swelling, leg pain, redness around wound, drainage from wound, or any other concerns or questions.   You can reach [place] at [phone].   FOLLOW-UP:  1. You have an appointment in 6 weeks with a ultrasound of your kidneys prior.  2. You have a string attached to your stent, you may remove it on Monday, October 17th. To do this, pull the strings until the stents are completely removed. You may feel an odd sensation in your back.AMBULATORY SURGERY  DISCHARGE INSTRUCTIONS   1) The drugs that you were given will stay in your system until tomorrow so for the next 24 hours you should not:  A) Drive an automobile B) Make any legal decisions C) Drink any alcoholic beverage   2) You may resume regular meals tomorrow.  Today it is better to start with liquids and gradually work up to solid  foods.  You may eat anything you prefer, but it is better to start with liquids, then soup and crackers, and gradually work up to solid foods.   3) Please notify your doctor immediately if you have any unusual bleeding, trouble breathing, redness and pain at the surgery site, drainage, fever, or pain not relieved by medication.    4) Additional Instructions:        Please contact your physician with any problems or Same Day Surgery at [phone], Monday through Friday 6 am to 4 pm, or [place] at [facility] number at [phone].

## 2015-03-16 NOTE — Op Note (Signed)
Preoperative diagnosis: left ureteral calculus  Postoperative diagnosis: left ureteral calculus  Procedure:  1. Cystoscopy 2. left ureteroscopy and stone removal 3. Ureteral balloon dilation 4. Ureteroscopic laser lithotripsy 5. left 57F x 26cm ureteral stent placement  6. left retrograde pyelography with interpretation  Surgeon: Crist Fat, MD  Anesthesia: General  Complications: None  Intraoperative findings: left retrograde pyelography demonstrated a filling defect within the left ureter consistent with the patient's known calculus and proximal hydronephrosis without other abnormalities.  EBL: Minimal  Specimens: 1. left ureteral calculus  Disposition of specimens: [facility] for stone analysis  Indication: Elijah [name] is a 59 y.o.   patient with urolithiasis. After reviewing the management options for treatment, the patient elected to proceed with the above surgical procedure(s). We have discussed the potential benefits and risks of the procedure, side effects of the proposed treatment, the likelihood of the patient achieving the goals of the procedure, and any potential problems that might occur during the procedure or recuperation. Informed consent has been obtained.  Description of procedure:  The patient was taken to the operating room and general anesthesia was induced.  The patient was placed in the dorsal lithotomy position, prepped and draped in the usual sterile fashion, and preoperative antibiotics were administered. A preoperative time-out was performed.   Cystourethroscopy was performed.  The patient's urethra was examined and was normal demonstrated bilobar prostatic hypertrophy. The bladder was then systematically examined in its entirety. There was no evidence for any bladder tumors, stones, or other mucosal pathology.    Attention then turned to the left ureteral orifice and a ureteral catheter was used to intubate the ureteral  orifice.  Omnipaque contrast was injected through the ureteral catheter and a retrograde pyelogram was performed with findings as dictated above.  A 0.38 sensor guidewire was then advanced up the left ureter into the renal pelvis under fluoroscopic guidance. The 6 Fr semirigid ureteroscope was then advanced into the ureter next to the guidewire and no calculus was identified.  I advanced the scope up to the renal pelvis and then pulled back slowly and again did not identify the stone. I then advanced a second wire over the scope and attempted to pass the flexible ureteroscope over the wire. I was unable to get this much beyond the UVJ. As such, I then used a balloon dilator to dilate the UVJ. I then needed to dilate the ureter over several segments so that the entire ureter was dilated. This point I passed a 12/14 French  55 cm ureteral access sheath over the wire and into the proximal ureter. At this point I was able to perform pyeloscopy and encountered the stone in the mid pole calyx.  The stone was then fragmented with the 365 micron holmium laser fiber on a setting of 0.6 and frequency of 6 Hz.   All stones were then removed from the ureter with an z-tip nitinol basket.  Reinspection of the renal pelvis and  ureter revealed no remaining visible stones or fragments.  the scope and access sheath were then removed.   The wire was then backloaded through the cystoscope and a ureteral stent was advance over the wire using Seldinger technique.  The stent was positioned appropriately under fluoroscopic and cystoscopic guidance.  The wire was then removed with an adequate stent curl noted in the renal pelvis as well as in the bladder.  The bladder was then emptied and the procedure ended.  The patient appeared to tolerate the  procedure well and without complications.  The patient was able to be awakened and transferred to the recovery unit in satisfactory condition.   Disposition: The tether of the stent  was left on and secured to the ventral aspect of the patient's penis. Instructions for removing the stent have been provided to the patient. This has been scheduled for followup in 6 weeks with a renal ultrasound.

## 2015-03-16 NOTE — H&P (View-Only) (Signed)
03/02/2015 3:39 PM   DAEJON LICH Apr 26, 1956 [id number]  Referring provider: Dion Body, MD [address] [place], [place] [address]  Chief Complaint  Patient presents with  . Nephrolithiasis    HPI:  1 - Recurrent Nephrolithiasis -  Pre 2016 - SWL x2, medical passage x3 02/2015 - Lt 758m mid ureteral stone with mod hydro (564m 850HU, SSD 12cm) lateral to L4 transverse process by CT, Rt 58m52mntra-renal x 3 non-obstructing  2 - Metabolic Stone Disease -  Eval 2016: BMP, PTH, Urate - pending; Composition - pending; 24 Hr Urines - pending  3 - Prostate Screening - No FHX prostate cancer 02/2015 DRE 40gm smooth/ PSA (nromal this year per report by PCP)  PMH sig for CAD/Stent (2013, no asymptomatic, on effient which he has come off for procedures, follows [facility]  Today " PhiAbbe [place]is seen in f/u above. He is curtenly on trial of medical passage for 5mm76mft ureteral stone but still having sig discomfort req percocet and he cannot work while on them. He desires definitive management.    PMH: Past Medical History  Diagnosis Date  . Kidney stones   . Acid reflux   . Heart attack   . Hyperlipidemia     Surgical History: Past Surgical History  Procedure Laterality Date  . Coronary stent placement    . Pacemaker insertion      Home Medications:    Medication List       This list is accurate as of: 03/02/15  3:39 PM.  Always use your most recent med list.               aspirin EC 81 MG tablet  Take 1 tablet by mouth daily.     atorvastatin 40 MG tablet  Commonly known as:  LIPITOR  Take 1 tablet by mouth daily.     EFFIENT 10 MG Tabs tablet  Generic drug:  prasugrel  Take 1 tablet by mouth daily.     naproxen 500 MG tablet  Commonly known as:  NAPROSYN  Take 1 tablet (500 mg total) by mouth 2 (two) times daily with a meal.     omeprazole 20 MG tablet  Commonly known as:  PRILOSEC OTC  Take 20 mg by mouth daily.     ondansetron 4 MG  tablet  Commonly known as:  ZOFRAN  Take 1 tablet (4 mg total) by mouth daily as needed for nausea or vomiting.     ondansetron 8 MG disintegrating tablet  Commonly known as:  ZOFRAN ODT  Take 1 tablet (8 mg total) by mouth every 8 (eight) hours as needed for nausea or vomiting.     oxyCODONE-acetaminophen 5-325 MG tablet  Commonly known as:  ROXICET  Take 1 tablet by mouth every 4 (four) hours as needed for severe pain.     promethazine 25 MG suppository  Commonly known as:  PHENERGAN  Place 1 suppository (25 mg total) rectally every 6 (six) hours as needed for nausea.     RANEXA 1000 MG SR tablet  Generic drug:  ranolazine  Take 1 tablet by mouth 2 (two) times daily.     spironolactone 25 MG tablet  Commonly known as:  ALDACTONE  Take 1 tablet by mouth daily.     tamsulosin 0.4 MG Caps capsule  Commonly known as:  FLOMAX  Take 1 capsule (0.4 mg total) by mouth daily.     tamsulosin 0.4 MG Caps capsule  Commonly  known as:  FLOMAX  Take 1 capsule (0.4 mg total) by mouth daily.     vitamin B-12 1000 MCG tablet  Commonly known as:  CYANOCOBALAMIN  Take 1 tablet by mouth daily.        Allergies:  Allergies  Allergen Reactions  . Hydrocodone-Acetaminophen Nausea And Vomiting    Causes vomiting and GI upset  . Morphine Nausea And Vomiting    Causes vomiting and GI upset  . Sulfa Antibiotics Rash    Family History: Family History  Problem Relation Age of Onset  . Urolithiasis Neg Hx   . Prostate cancer Neg Hx   . Kidney disease Neg Hx   . Kidney cancer Neg Hx     Social History:  reports that he has quit smoking. He does not have any smokeless tobacco history on file. He reports that he does not drink alcohol or use illicit drugs.  ROS:   Review of Systems  Gastrointestinal (upper)  : Negative for upper GI symptoms  Gastrointestinal (lower) : Negative for lower GI symptoms  Constitutional : Negative for symptoms  Skin: Negative for skin  symptoms  Eyes: Negative for eye symptoms  Ear/Nose/Throat : Negative for Ear/Nose/Throat symptoms  Hematologic/Lymphatic: Negative for Hematologic/Lymphatic symptoms  Cardiovascular : Negative for cardiovascular symptoms  Respiratory : Negative for respiratory symptoms  Endocrine: Negative for endocrine symptoms  Musculoskeletal: Negative for musculoskeletal symptoms  Neurological: Negative for neurological symptoms  Psychologic: Negative for psychiatric symptoms      Physical Exam: There were no vitals taken for this visit.  Constitutional:  Alert and oriented, No acute distress. HEENT: [place] AT, moist mucus membranes.  Trachea midline, no masses. Cardiovascular: No clubbing, cyanosis, or edema. Respiratory: Normal respiratory effort, no increased work of breathing. GI: Abdomen is soft, nontender, nondistended, no abdominal masses GU: No CVA tenderness. Mild Left CVAT. Phallus straight. No palpable testes masses / hernias. DRE 40gm smooth.  Skin: No rashes, bruises or suspicious lesions. Lymph: No cervical or inguinal adenopathy. Neurologic: Grossly intact, no focal deficits, moving all 4 extremities. Psychiatric: Normal mood and affect.  Laboratory Data: Lab Results  Component Value Date   WBC 12.8* 02/27/2015   HGB 14.9 02/27/2015   HCT 45.2 02/27/2015   MCV 89.5 02/27/2015   PLT 271 02/27/2015    Lab Results  Component Value Date   CREATININE 1.43* 02/27/2015    No results found for: PSA  No results found for: TESTOSTERONE  No results found for: HGBA1C  Urinalysis    Component Value Date/Time   COLORURINE YELLOW* 02/27/2015 1617   COLORURINE Yellow 11/10/2013 2124   APPEARANCEUR CLEAR* 02/27/2015 1617   APPEARANCEUR Clear 11/10/2013 2124   LABSPEC 1.019 02/27/2015 1617   LABSPEC 1.024 11/10/2013 2124   PHURINE 7.0 02/27/2015 1617   PHURINE 5.0 11/10/2013 2124   GLUCOSEU NEGATIVE 02/27/2015 1617   GLUCOSEU Negative 11/10/2013 2124    HGBUR 1+* 02/27/2015 1617   HGBUR Negative 11/10/2013 2124   BILIRUBINUR NEGATIVE 02/27/2015 1617   BILIRUBINUR Negative 02/19/2015 1523   BILIRUBINUR Negative 11/10/2013 2124   KETONESUR 1+* 02/27/2015 1617   KETONESUR Negative 11/10/2013 2124   PROTEINUR NEGATIVE 02/27/2015 1617   PROTEINUR Negative 11/10/2013 2124   NITRITE NEGATIVE 02/27/2015 1617   NITRITE Negative 02/19/2015 1523   NITRITE Negative 11/10/2013 2124   LEUKOCYTESUR NEGATIVE 02/27/2015 1617   LEUKOCYTESUR 1+* 02/19/2015 1523   LEUKOCYTESUR Negative 11/10/2013 2124     Assessment & Plan:   1 - Recurrent Nephrolithiasis - left  ureteral stone symptomatic. Discussed MET, SWL, URS and adamantly wants to proceed with left URS next avail. I agree. Will change to PO dilaudid 50m for interval. Warned to contact MD for refracotry sympotms or new fevers. He will stop effient 2-3 days prior to ureteroscopy as he has done previously now that >2years from cardiac sent.  2 - Metabolic Stone Disease - rec complete eval given multifocality and recurrence pattern.   BMP,PTH,Urate today, composition and 24 hr urines to follow  3 - Prostate Screening - DRE today nomral. Up to date this year.     No Follow-up on file.  MAlexis Frock [facility] [address] [place] [phone]

## 2015-03-16 NOTE — Anesthesia Procedure Notes (Signed)
Procedure Name: LMA Insertion Date/Time: 03/16/2015 9:06 AM Performed by: Shirlee Limerick, Naira Standiford Pre-anesthesia Checklist: Patient identified, Emergency Drugs available, Suction available and Patient being monitored Patient Re-evaluated:Patient Re-evaluated prior to inductionOxygen Delivery Method: Circle system utilized Preoxygenation: Pre-oxygenation with 100% oxygen Intubation Type: IV induction LMA Size: 5.0 Tube secured with: Tape Dental Injury: Teeth and Oropharynx as per pre-operative assessment

## 2015-03-19 ENCOUNTER — Encounter: Payer: Self-pay | Admitting: Urology

## 2015-03-19 LAB — STONE ANALYSIS
CA OXALATE, MONOHYDR.: 95 %
CA PHOS CRY STONE QL IR: 5 %
Stone Weight KSTONE: 71 mg

## 2015-03-23 ENCOUNTER — Ambulatory Visit
Admission: RE | Admit: 2015-03-23 | Discharge: 2015-03-23 | Disposition: A | Discharge status: Home / Self Care | Payer: Managed Care, Other (non HMO) | Source: Ambulatory Visit | Attending: Urology | Admitting: Urology

## 2015-03-23 ENCOUNTER — Other Ambulatory Visit: Payer: Self-pay

## 2015-03-23 ENCOUNTER — Other Ambulatory Visit: Payer: Managed Care, Other (non HMO)

## 2015-03-23 DIAGNOSIS — N200 Calculus of kidney: Secondary | ICD-10-CM | POA: Insufficient documentation

## 2015-03-23 DIAGNOSIS — N1330 Unspecified hydronephrosis: Secondary | ICD-10-CM | POA: Insufficient documentation

## 2015-03-23 NOTE — Progress Notes (Signed)
Pt called c/o severe flank pain, nausea and vomiting since removing stent last night. Per Dr. Edwyna ShellHart pt needs a STAT renal u/s. Pt made aware. Pt stated he would go to the imaging center now.

## 2015-04-01 ENCOUNTER — Telehealth: Payer: Self-pay | Admitting: Urology

## 2015-04-01 ENCOUNTER — Ambulatory Visit (INDEPENDENT_AMBULATORY_CARE_PROVIDER_SITE_OTHER): Payer: Managed Care, Other (non HMO) | Admitting: Urology

## 2015-04-01 VITALS — BP 151/100 | HR 84 | Ht 68.0 in | Wt 182.4 lb

## 2015-04-01 DIAGNOSIS — N200 Calculus of kidney: Secondary | ICD-10-CM

## 2015-04-01 MED ORDER — PHENAZOPYRIDINE HCL 100 MG PO TABS: 100.0000 mg | ORAL_TABLET | Freq: Three times a day (TID) | ORAL | Status: DC | PRN

## 2015-04-01 NOTE — Progress Notes (Signed)
04/01/2015 4:11 PM   Elijah [name] [birth year] [id number]  Referring provider: Marisue Ivan, MD [phone] [facility] [address] [facility] [place], [place] [address]  Chief Complaint  Patient presents with  . Flank Pain    HPI: The patient is a 59 year old male who recently underwent a cystoscopy ureteroscopy and removal on the left side. He was left with a string on the stent. He has since removed that. He complains of burning with urination and his penis as well as left lower quadrant pain. He is a recent renal ultrasound after the stent was pulled that shows no GU pathology. He does have some nonobstructing stones in the right side that were there previously. He is taken Tylenol 3 with some relief. He is requesting Percocet at this time. She's been treated for a UTI. He is on Cipro for that currently. He says the Cipro helped at first but is no longer helping. Again his biggest complaint is that dysuria.   PMH: Past Medical History  Diagnosis Date  . Kidney stones   . Acid reflux   . Hyperlipidemia   . Coronary artery disease   . Heart attack [facility]) December 2012  . Anginal pain (HCC)   . Presence of permanent cardiac pacemaker   . CHF (congestive heart failure) (HCC)   . Shortness of breath dyspnea   . History of panic attacks   . Anxiety   . Anemia   . Restless leg syndrome     Surgical History: Past Surgical History  Procedure Laterality Date  . Coronary stent placement    . Pacemaker insertion    . Cardiac catheterization    . Coronary angioplasty    . Extracorporeal shock wave lithotripsy    . Insert / replace / remove pacemaker    . Cystoscopy/ureteroscopy/holmium laser/stent placement Left 03/16/2015    Procedure: CYSTOSCOPY/URETEROSCOPY/HOLMIUM LASER/STENT PLACEMENT;  Surgeon: Crist Fat, MD;  Location: [facility] ORS;  Service: Urology;  Laterality: Left;  . Cystoscopy with stent placement Left 03/16/2015    Procedure: CYSTOSCOPY WITH STENT  PLACEMENT;  Surgeon: Crist Fat, MD;  Location: [facility] ORS;  Service: Urology;  Laterality: Left;  . Cystoscopy w/ retrogrades Left 03/16/2015    Procedure: CYSTOSCOPY WITH RETROGRADE PYELOGRAM;  Surgeon: Crist Fat, MD;  Location: [facility] ORS;  Service: Urology;  Laterality: Left;    Home Medications:    Medication List       This list is accurate as of: 04/01/15  4:11 PM.  Always use your most recent med list.               acetaminophen-codeine 300-30 MG tablet  Commonly known as:  TYLENOL #3  Take 1 tablet by mouth every 4 (four) hours as needed.     aspirin EC 81 MG tablet  Take 81 mg by mouth daily.     atorvastatin 40 MG tablet  Commonly known as:  LIPITOR  Take 40 mg by mouth daily at 6 PM.     ciprofloxacin 500 MG tablet  Commonly known as:  CIPRO  Take 1 tablet (500 mg total) by mouth once.     EFFIENT 10 MG Tabs tablet  Generic drug:  prasugrel  Take 10 mg by mouth daily.     naproxen 500 MG tablet  Commonly known as:  NAPROSYN  Take 1 tablet (500 mg total) by mouth 2 (two) times daily with a meal.     omeprazole 20 MG tablet  Commonly known as:  PRILOSEC OTC  Take 20 mg by mouth daily.     ondansetron 4 MG tablet  Commonly known as:  ZOFRAN  Take 1 tablet (4 mg total) by mouth daily as needed for nausea or vomiting.     phenazopyridine 100 MG tablet  Commonly known as:  PYRIDIUM  Take 1 tablet (100 mg total) by mouth 3 (three) times daily as needed for pain.     promethazine 25 MG suppository  Commonly known as:  PHENERGAN  Place 1 suppository (25 mg total) rectally every 6 (six) hours as needed for nausea.     RANEXA 1000 MG SR tablet  Generic drug:  ranolazine  Take 1,000 mg by mouth 2 (two) times daily.     spironolactone 25 MG tablet  Commonly known as:  ALDACTONE  Take 25 mg by mouth daily.     tamsulosin 0.4 MG Caps capsule  Commonly known as:  FLOMAX  Take 1 capsule (0.4 mg total) by mouth daily.     vitamin B-12 1000  MCG tablet  Commonly known as:  CYANOCOBALAMIN  Take 1 tablet by mouth daily.        Allergies:  Allergies  Allergen Reactions  . Hydrocodone-Acetaminophen Nausea And Vomiting    Causes vomiting and GI upset  . Morphine Nausea And Vomiting    Causes vomiting and GI upset  . Sulfa Antibiotics Rash    Family History: Family History  Problem Relation Age of Onset  . Urolithiasis Neg Hx   . Prostate cancer Neg Hx   . Kidney disease Neg Hx   . Kidney cancer Neg Hx   . Emphysema Father     Social History:  reports that he quit smoking about 3 years ago. His smoking use included Cigarettes. He smoked 2.00 packs per day. He has never used smokeless tobacco. He reports that he does not drink alcohol or use illicit drugs.  ROS: UROLOGY Frequent Urination?: No Hard to postpone urination?: No Burning/pain with urination?: Yes Get up at night to urinate?: Yes Leakage of urine?: No Urine stream starts and stops?: No Trouble starting stream?: No Do you have to strain to urinate?: No Blood in urine?: Yes Urinary tract infection?: No Sexually transmitted disease?: No Injury to kidneys or bladder?: No Painful intercourse?: No Weak stream?: No Erection problems?: No Penile pain?: No  Gastrointestinal Nausea?: Yes Vomiting?: No Indigestion/heartburn?: No Diarrhea?: No Constipation?: No  Constitutional Fever: No Night sweats?: No Weight loss?: No Fatigue?: Yes  Skin Skin rash/lesions?: No Itching?: No  Eyes Blurred vision?: No Double vision?: No  Ears/Nose/Throat Sore throat?: No Sinus problems?: No  Hematologic/Lymphatic Swollen glands?: No Easy bruising?: No  Cardiovascular Leg swelling?: No Chest pain?: No  Respiratory Cough?: No Shortness of breath?: No  Endocrine Excessive thirst?: No  Musculoskeletal Back pain?: No Joint pain?: No  Neurological Headaches?: No Dizziness?: No  Psychologic Depression?: No Anxiety?: No  Physical  Exam: BP 151/100 mmHg  Pulse 84  Ht 5\' 8"  (1.727 m)  Wt 182 lb 6.4 oz (82.736 kg)  BMI 27.74 kg/m2  Constitutional:  Alert and oriented, No acute distress. HEENT: [place] AT, moist mucus membranes.  Trachea midline, no masses. Cardiovascular: No clubbing, cyanosis, or edema. Respiratory: Normal respiratory effort, no increased work of breathing. GI: Abdomen is soft, nontender, nondistended, no abdominal masses GU: No CVA tenderness.  Skin: No rashes, bruises or suspicious lesions. Lymph: No cervical or inguinal adenopathy. Neurologic: Grossly intact, no focal deficits, moving all 4 extremities. Psychiatric: Normal  mood and affect.  Laboratory Data: Lab Results  Component Value Date   WBC 7.9 03/09/2015   HGB 13.7 03/09/2015   HCT 40.7 03/09/2015   MCV 88.5 03/09/2015   PLT 272 03/09/2015    Lab Results  Component Value Date   CREATININE 1.89* 03/09/2015    No results found for: PSA  No results found for: TESTOSTERONE  No results found for: HGBA1C  Urinalysis    Component Value Date/Time   COLORURINE YELLOW* 02/27/2015 1617   COLORURINE Yellow 11/10/2013 2124   APPEARANCEUR CLEAR* 02/27/2015 1617   APPEARANCEUR Clear 11/10/2013 2124   LABSPEC 1.019 02/27/2015 1617   LABSPEC 1.024 11/10/2013 2124   PHURINE 7.0 02/27/2015 1617   PHURINE 5.0 11/10/2013 2124   GLUCOSEU Negative 03/02/2015 1543   GLUCOSEU Negative 11/10/2013 2124   HGBUR 1+* 02/27/2015 1617   HGBUR Negative 11/10/2013 2124   BILIRUBINUR Negative 03/02/2015 1543   BILIRUBINUR NEGATIVE 02/27/2015 1617   BILIRUBINUR Negative 11/10/2013 2124   KETONESUR 1+* 02/27/2015 1617   KETONESUR Negative 11/10/2013 2124   PROTEINUR NEGATIVE 02/27/2015 1617   PROTEINUR Negative 11/10/2013 2124   NITRITE Negative 03/02/2015 1543   NITRITE NEGATIVE 02/27/2015 1617   NITRITE Negative 11/10/2013 2124   LEUKOCYTESUR Negative 03/02/2015 1543   LEUKOCYTESUR NEGATIVE 02/27/2015 1617   LEUKOCYTESUR Negative 11/10/2013  2124    Pertinent Imaging: Normal postop removal renal ultrasound exception of small nonobstructing right calculus.  Assessment & Plan:    1. Dysuria after ureteroscopy 2 weeks ago We will start the patient on 100 mg of Pyridium 3 times a day with a quantity of 10. We will also recheck a urine culture to ensure that he does not have a Cipro resistant urinary tract infection. He will call the office if his dysuria doesn't improve in the near future.  2. Nephrolithiasis We will check a renal ultrasound 6 months to confirm that stones in his right kidney have not changed tremendously in size. - POC urinalysis w microscopic (non auto) - CULTURE, URINE COMPREHENSIVE   Return in about 6 months (around 09/30/2015) for with renal ultrasound prior.  Hildred Laser, MD  [facility] [address], [address] [place], [place] [address] [phone]

## 2015-04-01 NOTE — Telephone Encounter (Signed)
Pt will be seen by Dr. Sherryl BartersBudzyn today.

## 2015-04-01 NOTE — Telephone Encounter (Signed)
Last night pt started hurting again in his kidneys & bladder, pain is a 3 right now on scale of 1-10.  Went to Urgent Care last Saturday for the same issues, was running a fever & they started pt on Cipro.  Tomorrow is last day of his Cipro course.  Pt wants to know if he can be seen today any time after 3:30.  He's at work today.  Please give him a call [phone].

## 2015-04-04 LAB — CULTURE, URINE COMPREHENSIVE

## 2015-04-13 ENCOUNTER — Ambulatory Visit: Payer: Managed Care, Other (non HMO)

## 2015-04-27 ENCOUNTER — Ambulatory Visit: Payer: Managed Care, Other (non HMO)

## 2015-08-31 ENCOUNTER — Encounter: Payer: Self-pay | Admitting: Urology

## 2015-08-31 ENCOUNTER — Ambulatory Visit (INDEPENDENT_AMBULATORY_CARE_PROVIDER_SITE_OTHER): Payer: Managed Care, Other (non HMO) | Admitting: Urology

## 2015-08-31 VITALS — BP 133/86 | HR 80 | Ht 68.0 in | Wt 188.0 lb

## 2015-08-31 DIAGNOSIS — R310 Gross hematuria: Secondary | ICD-10-CM | POA: Diagnosis not present

## 2015-08-31 DIAGNOSIS — N200 Calculus of kidney: Secondary | ICD-10-CM

## 2015-08-31 LAB — URINALYSIS, COMPLETE
BILIRUBIN UA: NEGATIVE
Glucose, UA: NEGATIVE
KETONES UA: NEGATIVE
LEUKOCYTES UA: NEGATIVE
Nitrite, UA: NEGATIVE
Protein, UA: NEGATIVE
Urobilinogen, Ur: 0.2 mg/dL (ref 0.2–1.0)
pH, UA: 5.5 (ref 5.0–7.5)

## 2015-08-31 LAB — MICROSCOPIC EXAMINATION
EPITHELIAL CELLS (NON RENAL): NONE SEEN /HPF (ref 0–10)
RBC, UA: >30 /hpf — ABNORMAL HIGH (ref 0–?)
WBC, UA: NONE SEEN /hpf (ref 0–?)

## 2015-08-31 MED ORDER — CIPROFLOXACIN HCL 500 MG PO TABS: 500.0000 mg | ORAL_TABLET | Freq: Two times a day (BID) | ORAL | Status: DC

## 2015-08-31 NOTE — Progress Notes (Signed)
60 year old male who presents today with new onset gross hematuria. The patient has history of nephrolithiasis and is status post left ureteroscopy in October 2016. At that point he has CT scan, noncontrast, demonstrating a non-obstructing stone in the right side. There are no additional stones or unusual findings. The patient states that the current episode of gross hematuria started about a week ago. He states that taking AZO has helped his symptoms but he stopped this several days ago so that we could more clearly evaluate him. He states that he has pain in the low back as well as pain radiating up into the right and left flank. He denies any fevers or chills. Interestingly, the patient is not having any dysuria. He also is describing mostly terminal hematuria. He has not had any flank pain leading up to this episode.  He has had some worsening nocturia although this is not been every night.  Current Outpatient Prescriptions on File Prior to Visit  Medication Sig Dispense Refill  . aspirin EC 81 MG tablet Take 81 mg by mouth daily.     Marland Kitchen atorvastatin (LIPITOR) 40 MG tablet Take 40 mg by mouth daily at 6 PM.     . EFFIENT 10 MG TABS tablet Take 10 mg by mouth daily.     Marland Kitchen omeprazole (PRILOSEC OTC) 20 MG tablet Take 20 mg by mouth daily.    Marland Kitchen RANEXA 1000 MG SR tablet Take 1,000 mg by mouth 2 (two) times daily.     Marland Kitchen spironolactone (ALDACTONE) 25 MG tablet Take 25 mg by mouth daily.     . vitamin B-12 (CYANOCOBALAMIN) 1000 MCG tablet Take 1 tablet by mouth daily.    Marland Kitchen acetaminophen-codeine (TYLENOL #3) 300-30 MG tablet Take 1 tablet by mouth every 4 (four) hours as needed. (Patient not taking: Reported on 08/31/2015) 20 tablet 0  . naproxen (NAPROSYN) 500 MG tablet Take 1 tablet (500 mg total) by mouth 2 (two) times daily with a meal. (Patient not taking: Reported on 08/31/2015) 20 tablet 0  . ondansetron (ZOFRAN) 4 MG tablet Take 1 tablet (4 mg total) by mouth daily as needed for nausea or vomiting.  (Patient not taking: Reported on 08/31/2015) 20 tablet 0  . phenazopyridine (PYRIDIUM) 100 MG tablet Take 1 tablet (100 mg total) by mouth 3 (three) times daily as needed for pain. (Patient not taking: Reported on 08/31/2015) 10 tablet 0  . promethazine (PHENERGAN) 25 MG suppository Place 1 suppository (25 mg total) rectally every 6 (six) hours as needed for nausea. (Patient not taking: Reported on 08/31/2015) 12 suppository 1  . tamsulosin (FLOMAX) 0.4 MG CAPS capsule Take 1 capsule (0.4 mg total) by mouth daily. (Patient not taking: Reported on 08/31/2015) 14 capsule 0   No current facility-administered medications on file prior to visit.   Past Medical History  Diagnosis Date  . Kidney stones   . Acid reflux   . Hyperlipidemia   . Coronary artery disease   . Heart attack [facility]) December 2012  . Anginal pain (HCC)   . Presence of permanent cardiac pacemaker   . CHF (congestive heart failure) (HCC)   . Shortness of breath dyspnea   . History of panic attacks   . Anxiety   . Anemia   . Restless leg syndrome    PE: NAD Abdomen is soft, no CVA tenderness, no suprapubic tenderness  UA - few bacteria, >30RBC/HPF  Imp: The etiology of the patient's gross hematuria is unclear in its time. He does  have some vague symptoms of infection. I sent the urine culture. Otherwise, he essentially had a hematuria evaluation in October with a normal cystoscopic exam and no significant pathology in the patient's left ureter or upper tract. He does have a nonobstructing stone in the right kidney but additionally has not had any right-sided renal colic. Terminal hematuria is commonly seen with the bladder stone, but he doesn't have any dysuria or associated symptoms. I suppose this would be possible but it would be a quick formation of bladder stone over the last 6 months.  Recommendation: I sent the urine recultured, I started the patient on ciprofloxacin 500 mg twice a day times 7 days. The patient will return  in 1 week and see if his symptoms and the hematuria resolved. He will also stop his Effient for 1 week. If the patient's symptoms are not improved then we would then discuss hematuria evaluation. Again, he recently had one 6 months ago which was essentially negative. The only other thing to add would be a contrast enhanced CT with delayed images.

## 2015-09-01 ENCOUNTER — Other Ambulatory Visit: Payer: Self-pay

## 2015-09-01 DIAGNOSIS — R310 Gross hematuria: Secondary | ICD-10-CM

## 2015-09-01 MED ORDER — CIPROFLOXACIN HCL 500 MG PO TABS: 500.0000 mg | ORAL_TABLET | Freq: Two times a day (BID) | ORAL | Status: DC

## 2015-09-02 LAB — CULTURE, URINE COMPREHENSIVE

## 2015-09-09 ENCOUNTER — Encounter: Payer: Self-pay | Admitting: Urology

## 2015-09-09 ENCOUNTER — Ambulatory Visit (INDEPENDENT_AMBULATORY_CARE_PROVIDER_SITE_OTHER): Payer: Managed Care, Other (non HMO) | Admitting: Urology

## 2015-09-09 VITALS — BP 125/73 | HR 76 | Ht 68.0 in | Wt 188.4 lb

## 2015-09-09 DIAGNOSIS — R109 Unspecified abdominal pain: Secondary | ICD-10-CM

## 2015-09-09 DIAGNOSIS — N529 Male erectile dysfunction, unspecified: Secondary | ICD-10-CM

## 2015-09-09 DIAGNOSIS — R310 Gross hematuria: Secondary | ICD-10-CM | POA: Diagnosis not present

## 2015-09-09 DIAGNOSIS — N200 Calculus of kidney: Secondary | ICD-10-CM | POA: Diagnosis not present

## 2015-09-09 DIAGNOSIS — N400 Enlarged prostate without lower urinary tract symptoms: Secondary | ICD-10-CM | POA: Diagnosis not present

## 2015-09-09 DIAGNOSIS — N528 Other male erectile dysfunction: Secondary | ICD-10-CM | POA: Diagnosis not present

## 2015-09-09 NOTE — Progress Notes (Signed)
09/09/2015 4:42 PM   Elijah [name] 02-22-1956 [id number]  Referring provider: Marisue Ivan, MD [phone] [facility] [address] [facility] [place], [place] [address]  Chief Complaint  Patient presents with  . Follow-up    kidney stone 1 week F/U    HPI: Patient is a 59 -year-old Caucasian male who presents today for a recheck on his gross hematuria.     He does have a prior history of nephrolithiasis, trauma to the genitourinary tract (s/p URS/LL/left ureteral stent in 03/2015) and BPH, he but does not have a prior history of recurrent urinary tract infections or malignancies of the genitourinary tract.  Patient denies any family medical history of nephrolithiasis, malignancies of the genitourinary tract or hematuria.   Today, he is not symptoms of frequent urination, urgency, dysuria, nocturia, incontinence, hesitancy, intermittency, straining to urinate or a weak urinary stream.  Urine culture from 08/31/2015 noted no growth.  UA today demonstrates  3-10 rbc's per high-power field.   His not experiencing any suprapubic pain or abdominal pain,but he admits to left flank pain. The pain is described as achy. It is intermittent.  He denies any associated fevers, chills, nausea or vomiting.   Patient underwent CT stone study on 02/27/2015 where an obstructing 6 mm stone was seen within the mid left ureter with hydronephrosis.  He is also found to have multiple non obstructing stones in the right kidney.  Renal ultrasound completed on 03/23/2015 noted mild left hydronephrosis and a 6 mm non obstructing mid right renal calculus.  He is a former smoker with a 2 ppd history.  Quit four years ago.     PMH: Past Medical History  Diagnosis Date  . Kidney stones   . Acid reflux   . Hyperlipidemia   . Coronary artery disease   . Heart attack [facility]) December 2012  . Anginal pain (HCC)   . Presence of permanent cardiac pacemaker   . CHF (congestive heart failure) (HCC)   .  Shortness of breath dyspnea   . History of panic attacks   . Anxiety   . Anemia   . Restless leg syndrome     Surgical History: Past Surgical History  Procedure Laterality Date  . Coronary stent placement    . Pacemaker insertion    . Cardiac catheterization    . Coronary angioplasty    . Extracorporeal shock wave lithotripsy    . Insert / replace / remove pacemaker    . Cystoscopy/ureteroscopy/holmium laser/stent placement Left 03/16/2015    Procedure: CYSTOSCOPY/URETEROSCOPY/HOLMIUM LASER/STENT PLACEMENT;  Surgeon: Crist Fat, MD;  Location: [facility] ORS;  Service: Urology;  Laterality: Left;  . Cystoscopy with stent placement Left 03/16/2015    Procedure: CYSTOSCOPY WITH STENT PLACEMENT;  Surgeon: Crist Fat, MD;  Location: [facility] ORS;  Service: Urology;  Laterality: Left;  . Cystoscopy w/ retrogrades Left 03/16/2015    Procedure: CYSTOSCOPY WITH RETROGRADE PYELOGRAM;  Surgeon: Crist Fat, MD;  Location: [facility] ORS;  Service: Urology;  Laterality: Left;    Home Medications:    Medication List       This list is accurate as of: 09/09/15  4:42 PM.  Always use your most recent med list.               acetaminophen-codeine 300-30 MG tablet  Commonly known as:  TYLENOL #3  Take 1 tablet by mouth every 4 (four) hours as needed.     aspirin EC 81 MG tablet  Take 81  mg by mouth daily.     atorvastatin 40 MG tablet  Commonly known as:  LIPITOR  Take 40 mg by mouth daily at 6 PM.     ciprofloxacin 500 MG tablet  Commonly known as:  CIPRO  Take 1 tablet (500 mg total) by mouth 2 (two) times daily.     EFFIENT 10 MG Tabs tablet  Generic drug:  prasugrel  Take 10 mg by mouth daily. Reported on 09/09/2015     naproxen 500 MG tablet  Commonly known as:  NAPROSYN  Take 1 tablet (500 mg total) by mouth 2 (two) times daily with a meal.     omeprazole 20 MG tablet  Commonly known as:  PRILOSEC OTC  Take 20 mg by mouth daily.     ondansetron 4 MG tablet    Commonly known as:  ZOFRAN  Take 1 tablet (4 mg total) by mouth daily as needed for nausea or vomiting.     phenazopyridine 100 MG tablet  Commonly known as:  PYRIDIUM  Take 1 tablet (100 mg total) by mouth 3 (three) times daily as needed for pain.     promethazine 25 MG suppository  Commonly known as:  PHENERGAN  Place 1 suppository (25 mg total) rectally every 6 (six) hours as needed for nausea.     RANEXA 1000 MG SR tablet  Generic drug:  ranolazine  Take 1,000 mg by mouth 2 (two) times daily.     spironolactone 25 MG tablet  Commonly known as:  ALDACTONE  Take 25 mg by mouth daily.     tamsulosin 0.4 MG Caps capsule  Commonly known as:  FLOMAX  Take 1 capsule (0.4 mg total) by mouth daily.     vitamin B-12 1000 MCG tablet  Commonly known as:  CYANOCOBALAMIN  Take 1 tablet by mouth daily.        Allergies:  Allergies  Allergen Reactions  . Hydrocodone-Acetaminophen Nausea And Vomiting    Causes vomiting and GI upset Causes vomiting and GI upset  . Morphine Nausea And Vomiting    Causes vomiting and GI upset  . Sulfa Antibiotics Rash    Family History: Family History  Problem Relation Age of Onset  . Urolithiasis Neg Hx   . Prostate cancer Neg Hx   . Kidney disease Neg Hx   . Kidney cancer Neg Hx   . Emphysema Father     Social History:  reports that he quit smoking about 4 years ago. His smoking use included Cigarettes. He smoked 2.00 packs per day. He has never used smokeless tobacco. He reports that he does not drink alcohol or use illicit drugs.  ROS: UROLOGY Frequent Urination?: No Hard to postpone urination?: No Burning/pain with urination?: No Get up at night to urinate?: No Leakage of urine?: No Urine stream starts and stops?: No Trouble starting stream?: No Do you have to strain to urinate?: No Blood in urine?: No Urinary tract infection?: No Sexually transmitted disease?: No Injury to kidneys or bladder?: No Painful intercourse?:  No Weak stream?: No Erection problems?: No Penile pain?: No  Gastrointestinal Nausea?: No Vomiting?: No Indigestion/heartburn?: No Diarrhea?: No Constipation?: No  Constitutional Fever: No Night sweats?: No Weight loss?: No Fatigue?: No  Skin Skin rash/lesions?: No Itching?: No  Eyes Blurred vision?: No Double vision?: No  Ears/Nose/Throat Sore throat?: No Sinus problems?: No  Hematologic/Lymphatic Swollen glands?: No Easy bruising?: No  Cardiovascular Leg swelling?: No Chest pain?: No  Respiratory Cough?: No Shortness of  breath?: No  Endocrine Excessive thirst?: No  Musculoskeletal Back pain?: No Joint pain?: No  Neurological Headaches?: No Dizziness?: No  Psychologic Depression?: No Anxiety?: No  Physical Exam: BP 125/73 mmHg  Pulse 76  Ht  (1.727 m)  Wt 188 lb 6.4 oz (85.458 kg)  BMI 28.65 kg/m2  Constitutional: Well nourished. Alert and oriented, No acute distress. HEENT: [place] AT, moist mucus membranes. Trachea midline, no masses. Cardiovascular: No clubbing, cyanosis, or edema. Respiratory: Normal respiratory effort, no increased work of breathing. GI: Abdomen is soft, non tender, non distended, no abdominal masses. Liver and spleen not palpable.  No hernias appreciated.  Stool sample for occult testing is not indicated.   GU: Mild left CVA tenderness.  No bladder fullness or masses.   Skin: No rashes, bruises or suspicious lesions. Lymph: No cervical or inguinal adenopathy. Neurologic: Grossly intact, no focal deficits, moving all 4 extremities. Psychiatric: Normal mood and affect.  Laboratory Data: Lab Results  Component Value Date   WBC 7.9 03/09/2015   HGB 13.7 03/09/2015   HCT 40.7 03/09/2015   MCV 88.5 03/09/2015   PLT 272 03/09/2015    Lab Results  Component Value Date   CREATININE 1.89* 03/09/2015    Lab Results  Component Value Date   AST 27 02/15/2015   Lab Results  Component Value Date   ALT 14*  02/15/2015    Urinalysis Results for orders placed or performed in visit on 09/09/15  Microscopic Examination  Result Value Ref Range   WBC, UA 0-5 0 -  5 /hpf   RBC, UA 3-10 (A) 0 -  2 /hpf   Epithelial Cells (non renal) None seen 0 - 10 /hpf   Bacteria, UA None seen None seen/Few   Urinalysis, Complete  Result Value Ref Range   Specific Gravity, UA >1.030 (H) 1.005 - 1.030   pH, UA 5.5 5.0 - 7.5   Color, UA Yellow Yellow   Appearance Ur Clear Clear   Leukocytes, UA Negative Negative   Protein, UA Negative Negative/Trace   Glucose, UA Negative Negative   Ketones, UA Negative Negative   RBC, UA Trace (A) Negative   Bilirubin, UA Negative Negative   Urobilinogen, Ur 0.2 0.2 - 1.0 mg/dL   Nitrite, UA Negative Negative   Microscopic Examination See below:       Assessment & Plan:    1. Gross hematuria:   Patient has not had recent gross hematuria, but he has persistent microscopic hematuria.  We'll pursue a CT urogram at this time.  Explained to patient the causes of blood in the urine are as follows: stones, BPH, UTI's, damage to the urinary tract and/or cancer.  It is explained to the patient that they will be scheduled for a CT Urogram with contrast material and that in rare instances, an allergic reaction can be serious and even life threatening with the injection of contrast material.   The patient denies any allergies to contrast, iodine and/or seafood and is not taking metformin.  Return for the report.  - BUN+Creat - CT Abdomen Pelvis W Wo Contrast; Future - Urinalysis, Complete  2. Left flank pain:   Patient will be undergoing a CT urogram for further evaluation.  Will return for the report.  3. Kidney stones:   Patient has several non obstructing stones located within the right kidney.  He'll be undergoing a CT urogram for further evaluation. He'll return for the report.  4. BPH:   Patient did have bilobar  prostate on cystoscopic be during his URS procedure.  Once  the workup for the hematuria is complete, patient will have an I PSS score and exam.   - PSA  5. Erectile dysfunction:   Patient given samples of Stendra.  He is warned not to [facility] with medications that contain nitrates.  I also advised him of the side effects, such as: headache, flushing, dyspepsia, abnormal vision, nasal congestion, back pain, myalgia, nausea, dizziness, and rash.  He is to take the medication on an empty stomach two hours prior to intercourse.  Once the workup for the hematuria is complete, patient will have a SHIM score and exam.   Return in about 1 week (around 09/16/2015) for CT Urogram report.  These notes generated with voice recognition software. I apologize for typographical errors.  Michiel CowboySHANNON Leanndra Pember, PA-C  [facility] [address], [address] [place], [place] [address] [phone]

## 2015-09-10 ENCOUNTER — Telehealth: Payer: Self-pay

## 2015-09-10 DIAGNOSIS — N529 Male erectile dysfunction, unspecified: Secondary | ICD-10-CM | POA: Insufficient documentation

## 2015-09-10 DIAGNOSIS — N400 Enlarged prostate without lower urinary tract symptoms: Secondary | ICD-10-CM | POA: Insufficient documentation

## 2015-09-10 DIAGNOSIS — R310 Gross hematuria: Secondary | ICD-10-CM | POA: Insufficient documentation

## 2015-09-10 DIAGNOSIS — R109 Unspecified abdominal pain: Secondary | ICD-10-CM | POA: Insufficient documentation

## 2015-09-10 DIAGNOSIS — N200 Calculus of kidney: Secondary | ICD-10-CM | POA: Insufficient documentation

## 2015-09-10 LAB — URINALYSIS, COMPLETE
BILIRUBIN UA: NEGATIVE
Glucose, UA: NEGATIVE
Ketones, UA: NEGATIVE
LEUKOCYTES UA: NEGATIVE
Nitrite, UA: NEGATIVE
PH UA: 5.5 (ref 5.0–7.5)
PROTEIN UA: NEGATIVE
Specific Gravity, UA: >1.03 — ABNORMAL HIGH (ref 1.005–1.030)
Urobilinogen, Ur: 0.2 mg/dL (ref 0.2–1.0)

## 2015-09-10 LAB — BUN+CREAT
BUN / CREAT RATIO: 10 (ref 9–20)
BUN: 12 mg/dL (ref 6–24)
Creatinine, Ser: 1.22 mg/dL (ref 0.76–1.27)
GFR, EST AFRICAN AMERICAN: 75 mL/min/{1.73_m2} (ref >59)
GFR, EST NON AFRICAN AMERICAN: 64 mL/min/{1.73_m2} (ref >59)

## 2015-09-10 LAB — MICROSCOPIC EXAMINATION
Bacteria, UA: NONE SEEN
EPITHELIAL CELLS (NON RENAL): NONE SEEN /HPF (ref 0–10)

## 2015-09-10 LAB — PSA: Prostate Specific Ag, Serum: 1.1 ng/mL (ref 0.0–4.0)

## 2015-09-10 NOTE — Telephone Encounter (Signed)
-----   Message from Harle BattiestShannon A McGowan, PA-C sent at 09/10/2015  8:20 AM EDT ----- PSA is normal and renal function are normal   Proceed with CT Urogram.

## 2015-09-13 NOTE — Telephone Encounter (Signed)
LMOM- PSA and renal function are normal. Can proceed with CT urogram.

## 2015-09-24 ENCOUNTER — Ambulatory Visit: Payer: Managed Care, Other (non HMO) | Admitting: Urology

## 2015-10-01 ENCOUNTER — Ambulatory Visit: Payer: Managed Care, Other (non HMO)

## 2016-05-04 ENCOUNTER — Emergency Department: Payer: Managed Care, Other (non HMO)

## 2016-05-04 ENCOUNTER — Emergency Department
Admission: EM | Admit: 2016-05-04 | Discharge: 2016-05-04 | Disposition: A | Discharge status: Home / Self Care | Payer: Managed Care, Other (non HMO) | Attending: Emergency Medicine | Admitting: Emergency Medicine

## 2016-05-04 ENCOUNTER — Encounter: Payer: Self-pay | Admitting: Emergency Medicine

## 2016-05-04 DIAGNOSIS — I509 Heart failure, unspecified: Secondary | ICD-10-CM | POA: Diagnosis not present

## 2016-05-04 DIAGNOSIS — R55 Syncope and collapse: Secondary | ICD-10-CM

## 2016-05-04 DIAGNOSIS — I4901 Ventricular fibrillation: Secondary | ICD-10-CM | POA: Insufficient documentation

## 2016-05-04 DIAGNOSIS — Z79899 Other long term (current) drug therapy: Secondary | ICD-10-CM | POA: Insufficient documentation

## 2016-05-04 DIAGNOSIS — I110 Hypertensive heart disease with heart failure: Secondary | ICD-10-CM | POA: Insufficient documentation

## 2016-05-04 DIAGNOSIS — I2510 Atherosclerotic heart disease of native coronary artery without angina pectoris: Secondary | ICD-10-CM | POA: Diagnosis not present

## 2016-05-04 DIAGNOSIS — Z87891 Personal history of nicotine dependence: Secondary | ICD-10-CM | POA: Diagnosis not present

## 2016-05-04 DIAGNOSIS — Z7982 Long term (current) use of aspirin: Secondary | ICD-10-CM | POA: Insufficient documentation

## 2016-05-04 DIAGNOSIS — Z950 Presence of cardiac pacemaker: Secondary | ICD-10-CM | POA: Insufficient documentation

## 2016-05-04 LAB — BASIC METABOLIC PANEL
Anion gap: 7 (ref 5–15)
BUN: 15 mg/dL (ref 6–20)
CALCIUM: 9.5 mg/dL (ref 8.9–10.3)
CO2: 26 mmol/L (ref 22–32)
Chloride: 105 mmol/L (ref 101–111)
Creatinine, Ser: 1.17 mg/dL (ref 0.61–1.24)
GFR calc non Af Amer: >60 mL/min (ref >60)
Glucose, Bld: 109 mg/dL — ABNORMAL HIGH (ref 65–99)
POTASSIUM: 3.7 mmol/L (ref 3.5–5.1)
Sodium: 138 mmol/L (ref 135–145)

## 2016-05-04 LAB — URINALYSIS COMPLETE WITH MICROSCOPIC (ARMC ONLY)
Bacteria, UA: NONE SEEN
Bilirubin Urine: NEGATIVE
Glucose, UA: NEGATIVE mg/dL
Hgb urine dipstick: NEGATIVE
KETONES UR: NEGATIVE mg/dL
Leukocytes, UA: NEGATIVE
NITRITE: NEGATIVE
PH: 5 (ref 5.0–8.0)
PROTEIN: NEGATIVE mg/dL
SPECIFIC GRAVITY, URINE: 1.016 (ref 1.005–1.030)
Squamous Epithelial / LPF: NONE SEEN

## 2016-05-04 LAB — CBC
HEMATOCRIT: 41.1 % (ref 40.0–52.0)
HEMOGLOBIN: 14.2 g/dL (ref 13.0–18.0)
MCH: 30.2 pg (ref 26.0–34.0)
MCHC: 34.6 g/dL (ref 32.0–36.0)
MCV: 87.5 fL (ref 80.0–100.0)
Platelets: 213 10*3/uL (ref 150–440)
RBC: 4.7 MIL/uL (ref 4.40–5.90)
RDW: 14.3 % (ref 11.5–14.5)
WBC: 5.8 10*3/uL (ref 3.8–10.6)

## 2016-05-04 LAB — TROPONIN I: Troponin I: <0.03 ng/mL (ref <0.03)

## 2016-05-04 LAB — GLUCOSE, CAPILLARY: Glucose-Capillary: 126 mg/dL — ABNORMAL HIGH (ref 65–99)

## 2016-05-04 MED ORDER — METOPROLOL TARTRATE 25 MG PO TABS
25.0000 mg | ORAL_TABLET | Freq: Once | ORAL | Status: AC
  Administered 2016-05-04: 25 mg via ORAL
  Filled 2016-05-04: qty 1

## 2016-05-04 MED ORDER — ACETAMINOPHEN 325 MG PO TABS
650.0000 mg | ORAL_TABLET | Freq: Once | ORAL | Status: AC
  Administered 2016-05-04: 650 mg via ORAL
  Filled 2016-05-04: qty 2

## 2016-05-04 MED ORDER — METOPROLOL TARTRATE 25 MG PO TABS: 25.0000 mg | ORAL_TABLET | Freq: Two times a day (BID) | ORAL | 0 refills | Status: DC

## 2016-05-04 NOTE — ED Notes (Signed)
Dr. Shaune PollackLord in room to discuss discharge instructions.

## 2016-05-04 NOTE — ED Notes (Signed)
Interrogated patients pacemaker at this time.

## 2016-05-04 NOTE — ED Triage Notes (Signed)
Patient presents to the ED post syncopal episode at work around 6:30am.  Patient reports feeling dizzy and lightheaded.  Co-workers told patient his face became very red and his eyes rolled back in his head.  Patient has a pacemaker defibrillator and reports now the area around his pacemaker is "sore".  Patient continues to feel dizzy.  Patient appears pale at this time.  Skin is warm and dry.  Patient reports co-workers told him he was unconscious for 1-2 minutes.   Patient is alert and oriented x 4.  Reports passing out in a chair so he did not hit his head or fall.

## 2016-05-04 NOTE — ED Notes (Signed)
Dr. Lord in room to reassess patient.  Will continue to monitor.   

## 2016-05-04 NOTE — ED Notes (Signed)
Spoke to [employer] representative who states that patient was shocked around 7:08 this morning due to a heart rate of approx. 316bpm.  Notified Dr. Shaune PollackLord at this time.

## 2016-05-04 NOTE — Discharge Instructions (Signed)
You were evaluated today after collapsing, and it was found that you had a heart rhythm called ventricular fibrillation which was treated appropriately with your defibrillator and returned back into normal rhythm.  Return to the emergency department immediately for any worsening symptoms including chest pain, nausea, sweats, trouble breathing, weakness, numbness, passing out, or any other symptoms concerning to you.  I spoke with the on-call cardiologist Dr. Lady GaryFath, who recommends to call the office to make an appointment, for tomorrow, or within the next week.  You are being started on a new medication called metoprolol to help prevent heart rate from going fast.

## 2016-05-04 NOTE — ED Provider Notes (Signed)
[facility] Emergency Department Provider Note ____________________________________________   I have reviewed the triage vital signs and the triage nursing note.  HISTORY  Chief Complaint Loss of Consciousness   Historian Patient  HPI Elijah [name] is a 60 y.o. male with a history of pacer/defibrillator and follows with Dr. Gwen PoundsKowalski cardiology, presents today after passing out.  He works night shift.  Has been feeling fine.  No recent illnesses, no vomiting or diarrhea or decreased po intake.  Today was sitting down talking to coworker when he states he had some dizzy/lightheadedness that he stated was like "vertigo."  And then he woke up to coworkers.  Reportedly his face got red and eyes rolled back and became unresponsive- no seizure activity. No chest pain, or trouble breathing, or palpitations.  Reported mild nausea before and now.  Since in the ER he has developed a mild gradual onset headache that he's requesting tylenol for.  Denies focal weakness or numbness.  Has some soreness around chest wall where the defibrillator is located but did not report shock.  One prior syncope when he was standing up, sound like years ago.  No injury from syncope, was sitting the whole time.   Past Medical History:  Diagnosis Date  . Acid reflux   . Anemia   . Anginal pain (HCC)   . Anxiety   . CHF (congestive heart failure) (HCC)   . Coronary artery disease   . Heart attack December 2012  . History of panic attacks   . Hyperlipidemia   . Kidney stones   . Presence of permanent cardiac pacemaker   . Restless leg syndrome   . Shortness of breath dyspnea     Patient Active Problem List   Diagnosis Date Noted  . BPH (benign prostatic hyperplasia) 09/10/2015  . Kidney stones 09/10/2015  . Gross hematuria 09/10/2015  . Flank pain 09/10/2015  . 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 Nephrolithiasis 03/02/2015  . Combined fat and carbohydrate  induced hyperlipemia 03/05/2014  . Episode of syncope 11/25/2013  . Dizziness 11/21/2013  . CCF (congestive cardiac failure) (HCC) 11/21/2013  . Congestive heart failure (HCC) 11/21/2013  . Acid reflux 03/22/2012  . Essential (primary) hypertension 03/22/2012  . Arteriosclerosis of coronary artery 03/22/2012    Past Surgical History:  Procedure Laterality Date  . CARDIAC CATHETERIZATION    . CORONARY ANGIOPLASTY    . CORONARY STENT PLACEMENT    . CYSTOSCOPY W/ RETROGRADES Left 03/16/2015   Procedure: CYSTOSCOPY WITH RETROGRADE PYELOGRAM;  Surgeon: Crist FatBenjamin W Herrick, MD;  Location: [facility] ORS;  Service: Urology;  Laterality: Left;  . CYSTOSCOPY WITH STENT PLACEMENT Left 03/16/2015   Procedure: CYSTOSCOPY WITH STENT PLACEMENT;  Surgeon: Crist FatBenjamin W Herrick, MD;  Location: [facility] ORS;  Service: Urology;  Laterality: Left;  . CYSTOSCOPY/URETEROSCOPY/HOLMIUM LASER/STENT PLACEMENT Left 03/16/2015   Procedure: CYSTOSCOPY/URETEROSCOPY/HOLMIUM LASER/STENT PLACEMENT;  Surgeon: Crist FatBenjamin W Herrick, MD;  Location: [facility] ORS;  Service: Urology;  Laterality: Left;  . EXTRACORPOREAL SHOCK WAVE LITHOTRIPSY    . INSERT / REPLACE / REMOVE PACEMAKER    . PACEMAKER INSERTION      Prior to Admission medications   Medication Sig Start Date End Date Taking? Authorizing Provider  aspirin EC 81 MG tablet Take 81 mg by mouth daily.    Yes Historical Provider, MD  atorvastatin (LIPITOR) 40 MG tablet Take 40 mg by mouth daily at 6 PM.  02/03/15  Yes Historical Provider, MD  EFFIENT 10 MG TABS tablet Take 10  mg by mouth daily. Reported on 09/09/2015 01/08/15  Yes Historical Provider, MD  Multiple Vitamin (MULTIVITAMIN) tablet Take 1 tablet by mouth daily.   Yes Historical Provider, MD  omeprazole (PRILOSEC OTC) 20 MG tablet Take 20 mg by mouth daily.   Yes Historical Provider, MD  RANEXA 1000 MG SR tablet Take 1,000 mg by mouth 2 (two) times daily.  01/07/15  Yes Historical Provider, MD  ranitidine (ZANTAC) 75 MG tablet  Take 75 mg by mouth 2 (two) times daily.   Yes Historical Provider, MD  spironolactone (ALDACTONE) 25 MG tablet Take 25 mg by mouth daily.  01/07/15  Yes Historical Provider, MD  vitamin B-12 (CYANOCOBALAMIN) 1000 MCG tablet Take 1 tablet by mouth daily.   Yes Historical Provider, MD  metoprolol tartrate (LOPRESSOR) 25 MG tablet Take 1 tablet (25 mg total) by mouth 2 (two) times daily. 05/04/16 05/04/17  Governor Rooksebecca Paola Aleshire, MD    Allergies  Allergen Reactions  . Hydrocodone-Acetaminophen Nausea And Vomiting    Causes vomiting and GI upset Causes vomiting and GI upset  . Morphine Nausea And Vomiting    Causes vomiting and GI upset  . Sulfa Antibiotics Rash    Family History  Problem Relation Age of Onset  . Emphysema Father   . Urolithiasis Neg Hx   . Prostate cancer Neg Hx   . Kidney disease Neg Hx   . Kidney cancer Neg Hx     Social History Social History  Substance Use Topics  . Smoking status: Former Smoker    Packs/day: 2.00    Types: Cigarettes    Quit date: 04/06/2011  . Smokeless tobacco: Never Used  . Alcohol use No    Review of Systems  Constitutional: Negative for fever. Eyes: Negative for visual changes. ENT: Negative for sore throat. Cardiovascular: Negative for chest pain. Mild soreness to the left chest wall. Respiratory: Negative for shortness of breath. Gastrointestinal: Negative for abdominal pain, vomiting and diarrhea.  Mild nausea. Genitourinary: Negative for dysuria. Musculoskeletal: Negative for back pain. Skin: Negative for rash. Neurological: Positive for mild headache. 10 point Review of Systems otherwise negative ____________________________________________   PHYSICAL EXAM:  VITAL SIGNS: ED Triage Vitals  Enc Vitals Group     BP 05/04/16 0805 137/85     Pulse Rate 05/04/16 0805 76     Resp 05/04/16 0805 14     Temp 05/04/16 0805 98.2 F (36.8 C)     Temp Source 05/04/16 0805 Oral     SpO2 05/04/16 0805 97 %     Weight 05/04/16 0806 180  lb (81.6 kg)     Height 05/04/16 0806 5\' 8"  (1.727 m)     Head Circumference --      Peak Flow --      Pain Score 05/04/16 0806 2     Pain Loc --      Pain Edu? --      Excl. in GC? --      Constitutional: Alert and oriented. Well appearing and in no distress. HEENT   Head: Normocephalic and atraumatic.      Eyes: Conjunctivae are normal. PERRL. Normal extraocular movements.      Ears:         Nose: No congestion/rhinnorhea.   Mouth/Throat: Mucous membranes are moist.   Neck: No stridor. Cardiovascular/Chest: Normal rate, regular rhythm.  No murmurs, rubs, or gallops. Respiratory: Normal respiratory effort without tachypnea nor retractions. Breath sounds are clear and equal bilaterally. No wheezes/rales/rhonchi. Gastrointestinal: Soft. No distention,  no guarding, no rebound. Nontender.    Genitourinary/rectal:Deferred Musculoskeletal: Nontender with normal range of motion in all extremities. No joint effusions.  No lower extremity tenderness.  No edema. Neurologic:  No facial droop.  Normal speech and language. No gross or focal neurologic deficits are appreciated. Skin:  Skin is warm, dry and intact. No rash noted. Psychiatric: Mood and affect are normal. Speech and behavior are normal. Patient exhibits appropriate insight and judgment.   ____________________________________________  LABS (pertinent positives/negatives)  Labs Reviewed  BASIC METABOLIC PANEL - Abnormal; Notable for the following:       Result Value   Glucose, Bld 109 (*)    All other components within normal limits  URINALYSIS COMPLETEWITH MICROSCOPIC ([facility] ONLY) - Abnormal; Notable for the following:    Color, Urine YELLOW (*)    APPearance CLEAR (*)    All other components within normal limits  GLUCOSE, CAPILLARY - Abnormal; Notable for the following:    Glucose-Capillary 126 (*)    All other components within normal limits  CBC  TROPONIN I  CBG MONITORING, ED     ____________________________________________    EKG I, Governor Rooks, MD, the attending physician have personally viewed and interpreted all ECGs.  73 bpm. Normal sinus rhythm. PA-C and PVC. Narrow QRS. Normal axis. Nonspecific T-wave ____________________________________________  RADIOLOGY All Xrays were viewed by me. Imaging interpreted by Radiologist.  Ct Head without contrast:   IMPRESSION: 1. No acute intracranial abnormality. 2. Probable retention cyst in the lateral left frontal sinus. __________________________________________  PROCEDURES  Procedure(s) performed: None  Critical Care performed: None  ____________________________________________   ED COURSE / ASSESSMENT AND PLAN  Pertinent labs & imaging results that were available during my care of the patient were reviewed by me and considered in my medical decision making (see chart for details).  Mr. Parker is here after an episode of syncope with the preceding symptom of mild lightheadedness and nausea, and feels back to baseline other than mild soreness to the left chest wall and some persistent mild nausea, and then onset of mild headache since she's been here at the ER.  No neurologic complaints reported, an intact neurologic exam. No specific cardiac complaints.  ECG with few PAC and PVC but no significant arrhythmia or ischemic findings.   [employer] device interrogation shows episode of ventricular fibrillation that was appropriately shocked and returned to normal rhythm.  I initially spoke with [facility] cardiac electrophysiologist Dr. Maisie Fus, who recommended no indication for transfer to [facility], but potentially hospital admission for the arrhythmia leading to syncope. I spoke with the patient's cardiology group, Dr. Lady Gary who recommended patient would not necessarily need to be admitted to the hospital. Patient does not want stay in the hospital about a follow-up. No additional ongoing source or cause for  arrhythmia today.  Will give metoprolol tartrate 25 mg twice daily as recommended by Dr. Lady Gary. Patient to call and make an appointment within the next week.    CONSULTATIONS:   Dr. Maisie Fus, [facility] cardiac electrophysiology, phone discussion. Dr. Lady Gary by phone.   Patient / Family / Caregiver informed of clinical course, medical decision-making process, and agree with plan.   I discussed return precautions, follow-up instructions, and discharge instructions with patient and/or family.   ___________________________________________   FINAL CLINICAL IMPRESSION(S) / ED DIAGNOSES   Final diagnoses:  Ventricular fibrillation (HCC)  Syncope and collapse              Note: This dictation was prepared with Dragon dictation. Any  transcriptional errors that result from this process are unintentional    Governor Rooks, MD 05/04/16 1205

## 2016-06-07 DIAGNOSIS — Z95810 Presence of automatic (implantable) cardiac defibrillator: Secondary | ICD-10-CM | POA: Insufficient documentation

## 2016-06-23 ENCOUNTER — Emergency Department
Admission: EM | Admit: 2016-06-23 | Discharge: 2016-06-23 | Disposition: A | Discharge status: Home / Self Care | Payer: Managed Care, Other (non HMO) | Attending: Emergency Medicine | Admitting: Emergency Medicine

## 2016-06-23 ENCOUNTER — Encounter: Payer: Self-pay | Admitting: Emergency Medicine

## 2016-06-23 DIAGNOSIS — Z87891 Personal history of nicotine dependence: Secondary | ICD-10-CM | POA: Insufficient documentation

## 2016-06-23 DIAGNOSIS — I110 Hypertensive heart disease with heart failure: Secondary | ICD-10-CM | POA: Insufficient documentation

## 2016-06-23 DIAGNOSIS — I2510 Atherosclerotic heart disease of native coronary artery without angina pectoris: Secondary | ICD-10-CM | POA: Insufficient documentation

## 2016-06-23 DIAGNOSIS — I509 Heart failure, unspecified: Secondary | ICD-10-CM | POA: Diagnosis not present

## 2016-06-23 DIAGNOSIS — Z79899 Other long term (current) drug therapy: Secondary | ICD-10-CM | POA: Insufficient documentation

## 2016-06-23 DIAGNOSIS — R05 Cough: Secondary | ICD-10-CM | POA: Diagnosis present

## 2016-06-23 DIAGNOSIS — J101 Influenza due to other identified influenza virus with other respiratory manifestations: Secondary | ICD-10-CM

## 2016-06-23 DIAGNOSIS — Z7982 Long term (current) use of aspirin: Secondary | ICD-10-CM | POA: Insufficient documentation

## 2016-06-23 DIAGNOSIS — J09X2 Influenza due to identified novel influenza A virus with other respiratory manifestations: Secondary | ICD-10-CM | POA: Diagnosis not present

## 2016-06-23 LAB — INFLUENZA PANEL BY PCR (TYPE A & B)
INFLBPCR: NEGATIVE
Influenza A By PCR: POSITIVE — AB

## 2016-06-23 MED ORDER — OSELTAMIVIR PHOSPHATE 75 MG PO CAPS: 75.0000 mg | ORAL_CAPSULE | Freq: Two times a day (BID) | ORAL | 0 refills | Status: AC

## 2016-06-23 MED ORDER — OXYCODONE-ACETAMINOPHEN 5-325 MG PO TABS: 1.0000 | ORAL_TABLET | ORAL | 0 refills | Status: DC | PRN

## 2016-06-23 NOTE — ED Triage Notes (Signed)
Pt reports fever, sore throat and cough x2 days. Reports medicating last night with OTC medication.

## 2016-06-23 NOTE — ED Provider Notes (Signed)
[facility] Emergency Department Provider Note   ____________________________________________   First MD Initiated Contact with Patient 06/23/16 [phone]     (approximate)  I have reviewed the triage vital signs and the nursing notes.   HISTORY  Chief Complaint Fever and Cough   HPI Elijah [name] is a 61 y.o. male is here with complaint of sore throat, fever, cough for 2 days. Patient states that last night he began aching all over. He has not had any relief with over-the-counter medication. He states that symptoms came on suddenly. Cough and fever became much worse last p.m. Patient states that his fever was approximately 101 last night. He denies any nausea, vomiting or diarrhea. Patient did not take flu shot this year. He is unaware of any exposure to flu.He states he has been up all night coughing. Currently he rates his pain as 4 out of 10.   Past Medical History:  Diagnosis Date  . Acid reflux   . Anemia   . Anginal pain (HCC)   . Anxiety   . CHF (congestive heart failure) (HCC)   . Coronary artery disease   . Heart attack December 2012  . History of panic attacks   . Hyperlipidemia   . Kidney stones   . Presence of permanent cardiac pacemaker   . Restless leg syndrome   . Shortness of breath dyspnea     Patient Active Problem List   Diagnosis Date Noted  . BPH (benign prostatic hyperplasia) 09/10/2015  . Kidney stones 09/10/2015  . Gross hematuria 09/10/2015  . Flank pain 09/10/2015  . 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 Nephrolithiasis 03/02/2015  . 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 Episode of syncope 11/25/2013  . Dizziness 11/21/2013  . CCF (congestive cardiac failure) (HCC) 11/21/2013  . Congestive heart failure (HCC) 11/21/2013  . Acid reflux 03/22/2012  . Essential (primary) hypertension 03/22/2012  . Arteriosclerosis of coronary artery 03/22/2012    Past Surgical History:    Procedure Laterality Date  . CARDIAC CATHETERIZATION    . CORONARY ANGIOPLASTY    . CORONARY STENT PLACEMENT    . CYSTOSCOPY W/ RETROGRADES Left 03/16/2015   Procedure: CYSTOSCOPY WITH RETROGRADE PYELOGRAM;  Surgeon: Crist Fat, MD;  Location: [facility] ORS;  Service: Urology;  Laterality: Left;  . CYSTOSCOPY WITH STENT PLACEMENT Left 03/16/2015   Procedure: CYSTOSCOPY WITH STENT PLACEMENT;  Surgeon: Crist Fat, MD;  Location: [facility] ORS;  Service: Urology;  Laterality: Left;  . CYSTOSCOPY/URETEROSCOPY/HOLMIUM LASER/STENT PLACEMENT Left 03/16/2015   Procedure: CYSTOSCOPY/URETEROSCOPY/HOLMIUM LASER/STENT PLACEMENT;  Surgeon: Crist Fat, MD;  Location: [facility] ORS;  Service: Urology;  Laterality: Left;  . EXTRACORPOREAL SHOCK WAVE LITHOTRIPSY    . INSERT / REPLACE / REMOVE PACEMAKER    . PACEMAKER INSERTION      Prior to Admission medications   Medication Sig Start Date End Date Taking? Authorizing Provider  aspirin EC 81 MG tablet Take 81 mg by mouth daily.     Historical Provider, MD  atorvastatin (LIPITOR) 40 MG tablet Take 40 mg by mouth daily at 6 PM.  02/03/15   Historical Provider, MD  EFFIENT 10 MG TABS tablet Take 10 mg by mouth daily. Reported on 09/09/2015 01/08/15   Historical Provider, MD  metoprolol tartrate (LOPRESSOR) 25 MG tablet Take 1 tablet (25 mg total) by mouth 2 (two) times daily. 05/04/16 05/04/17  Governor Rooks, MD  Multiple Vitamin (MULTIVITAMIN) tablet Take 1 tablet by mouth daily.  Historical Provider, MD  omeprazole (PRILOSEC OTC) 20 MG tablet Take 20 mg by mouth daily.    Historical Provider, MD  oseltamivir (TAMIFLU) 75 MG capsule Take 1 capsule (75 mg total) by mouth 2 (two) times daily. 06/23/16 06/28/16  Tommi Rumpshonda L Charlesetta Milliron, PA-C  oxyCODONE-acetaminophen (PERCOCET) 5-325 MG tablet Take 1 tablet by mouth every 4 (four) hours as needed for severe pain. 06/23/16   Jjesus Dingley L Ajwa Kimberley, PA-C  RANEXA 1000 MG SR tablet Take 1,000 mg by mouth 2 (two) times daily.   01/07/15   Historical Provider, MD  ranitidine (ZANTAC) 75 MG tablet Take 75 mg by mouth 2 (two) times daily.    Historical Provider, MD  spironolactone (ALDACTONE) 25 MG tablet Take 25 mg by mouth daily.  01/07/15   Historical Provider, MD  vitamin B-12 (CYANOCOBALAMIN) 1000 MCG tablet Take 1 tablet by mouth daily.    Historical Provider, MD    Allergies Hydrocodone-acetaminophen; Morphine; and Sulfa antibiotics  Family History  Problem Relation Age of Onset  . Emphysema Father   . Urolithiasis Neg Hx   . Prostate cancer Neg Hx   . Kidney disease Neg Hx   . Kidney cancer Neg Hx     Social History Social History  Substance Use Topics  . Smoking status: Former Smoker    Packs/day: 2.00    Types: Cigarettes    Quit date: 04/06/2011  . Smokeless tobacco: Never Used  . Alcohol use No    Review of Systems Constitutional: Positive fever/positive chills Eyes: No visual changes. ENT: Positive sore throat. Cardiovascular: Denies chest pain. Respiratory: Denies shortness of breath. Positive cough. Gastrointestinal: No abdominal pain.  No nausea, no vomiting.  No diarrhea.   Musculoskeletal: Negative for back pain. Skin: Negative for rash. Neurological: Negative for headaches, focal weakness or numbness.  10-point ROS otherwise negative.  ____________________________________________   PHYSICAL EXAM:  VITAL SIGNS: ED Triage Vitals  Enc Vitals Group     BP 06/23/16 0826 116/77     Pulse Rate 06/23/16 0826 (!) 102     Resp 06/23/16 0826 20     Temp 06/23/16 0826 98.5 F (36.9 C)     Temp Source 06/23/16 0826 Oral     SpO2 06/23/16 0826 98 %     Weight 06/23/16 0826 188 lb (85.3 kg)     Height 06/23/16 0826 5\' 8"  (1.727 m)     Head Circumference --      Peak Flow --      Pain Score 06/23/16 0824 4     Pain Loc --      Pain Edu? --      Excl. in GC? --     Constitutional: Alert and oriented. Well appearing and in no acute distress. Eyes: Conjunctivae are normal. PERRL.  EOMI. Head: Atraumatic. Nose: Mild congestion/rhinnorhea.  EACs are clear bilaterally. TMs are dull without erythema or injection. Mouth/Throat: Mucous membranes are moist.  Oropharynx non-erythematous. Neck: No stridor.   Hematological/Lymphatic/Immunilogical: No cervical lymphadenopathy. Cardiovascular: Normal rate, regular rhythm. Grossly normal heart sounds.  Good peripheral circulation. Respiratory: Normal respiratory effort.  No retractions. Lungs CTAB. Gastrointestinal: Soft and nontender. No distention.  Musculoskeletal: Moves upper and lower extremities without any difficulty. Neurologic:  Normal speech and language. No gross focal neurologic deficits are appreciated. No gait instability. Skin:  Skin is warm, dry and intact. No rash noted. Psychiatric: Mood and affect are normal. Speech and behavior are normal.  ____________________________________________   LABS (all labs ordered are listed, but  only abnormal results are displayed)  Labs Reviewed  INFLUENZA PANEL BY PCR (TYPE A & B) - Abnormal; Notable for the following:       Result Value   Influenza A By PCR POSITIVE (*)    All other components within normal limits     PROCEDURES  Procedure(s) performed: None  Procedures  Critical Care performed: No  ____________________________________________   INITIAL IMPRESSION / ASSESSMENT AND PLAN / ED COURSE  Pertinent labs & imaging results that were available during my care of the patient were reviewed by me and considered in my medical decision making (see chart for details).  Patient was made aware that his influenza test was positive for influenza A. He is given a prescription for Tamiflu twice a day for 5 days. He is also given a prescription for Percocet as needed for cough and for muscle and body aches. He is aware that he needs to increase fluids and that he is contagious. Patient was also given a note for work.    ____________________________________________   FINAL CLINICAL IMPRESSION(S) / ED DIAGNOSES  Final diagnoses:  Influenza A      NEW MEDICATIONS STARTED DURING THIS VISIT:  New Prescriptions   OSELTAMIVIR (TAMIFLU) 75 MG CAPSULE    Take 1 capsule (75 mg total) by mouth 2 (two) times daily.   OXYCODONE-ACETAMINOPHEN (PERCOCET) 5-325 MG TABLET    Take 1 tablet by mouth every 4 (four) hours as needed for severe pain.     Note:  This document was prepared using Dragon voice recognition software and may include unintentional dictation errors.    Tommi Rumps, PA-C 06/23/16 1030    Charlynne Pander, MD 06/26/16 [phone]

## 2016-06-23 NOTE — Discharge Instructions (Signed)
Follow-up with your primary care doctor if any continued problems or any worsening. Increase fluids. Begin taking Tamiflu twice a day for the next 5 days. Percocet as needed for body aches and for coughing. You may also take Robitussin or Delsym over-the-counter as needed for cough.

## 2016-06-23 NOTE — ED Notes (Signed)
See triage note  States he developed sore throat yesterday  Cough and fever became worse last pm

## 2017-02-04 ENCOUNTER — Telehealth: Payer: Self-pay | Admitting: Urology

## 2017-02-04 NOTE — Telephone Encounter (Signed)
error 

## 2017-08-13 ENCOUNTER — Other Ambulatory Visit: Payer: Self-pay | Admitting: Family Medicine

## 2017-08-13 DIAGNOSIS — R55 Syncope and collapse: Secondary | ICD-10-CM

## 2017-08-14 ENCOUNTER — Ambulatory Visit: Payer: Managed Care, Other (non HMO)

## 2017-10-31 IMAGING — CT CT HEAD W/O CM
3 series · 16 of 47 positions shown, 19 images · non-contrast
Comparison: CT brain scan of 11/11/2013

CLINICAL DATA: Syncopal episode last night, frontal headache and
vertigo

EXAM:
CT HEAD WITHOUT CONTRAST
TECHNIQUE: Contiguous axial images were obtained from the base of the skull
through the vertex without intravenous contrast.

[Series 2: head wo · axial · 0.41mm/px · z∈[-76,+49]mm · 10 of 31 slices shown, 13 images]
[im 3/31  brain]
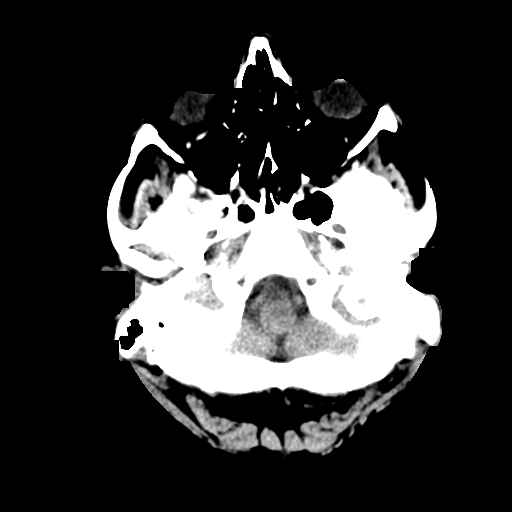
[im 3/31  bone]
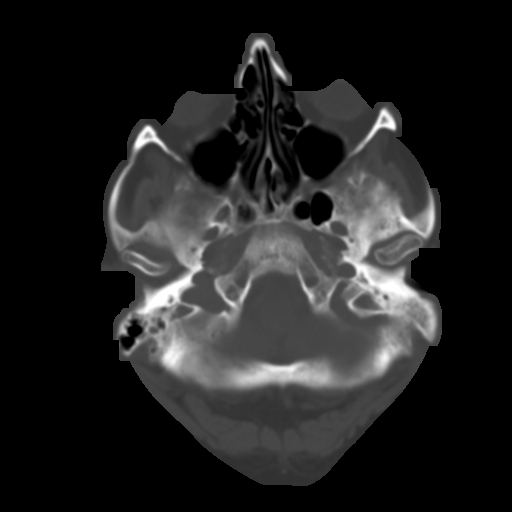
[im 6/31  brain]
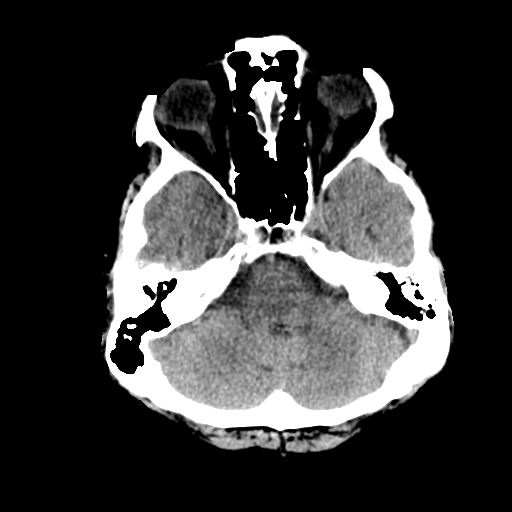
[im 9/31  brain]
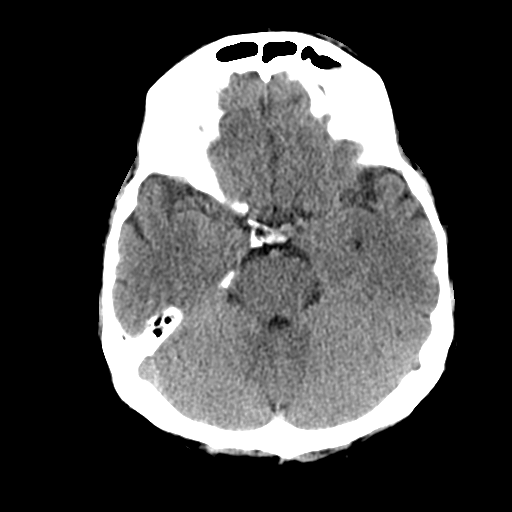
[im 11/31  brain]
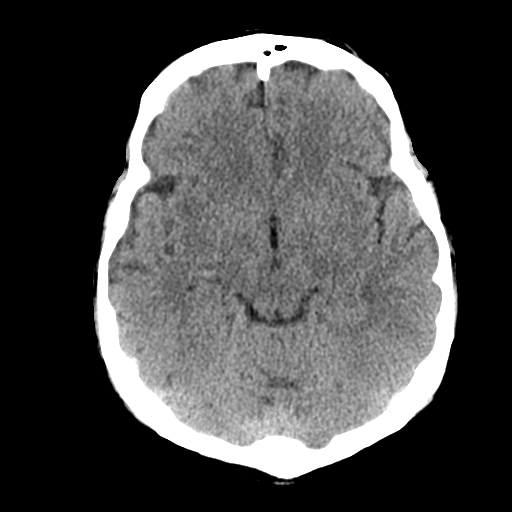
[im 14/31  brain]
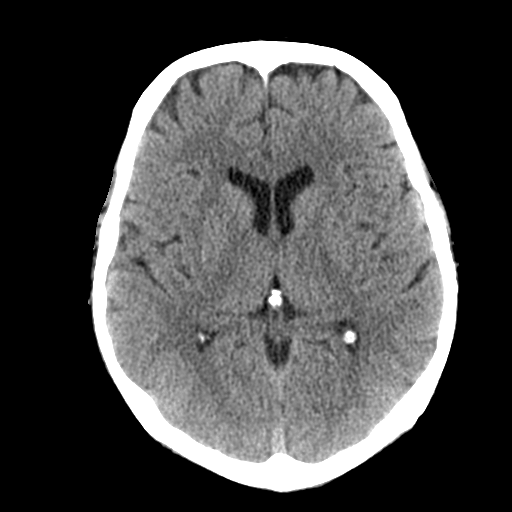
[im 14/31  bone]
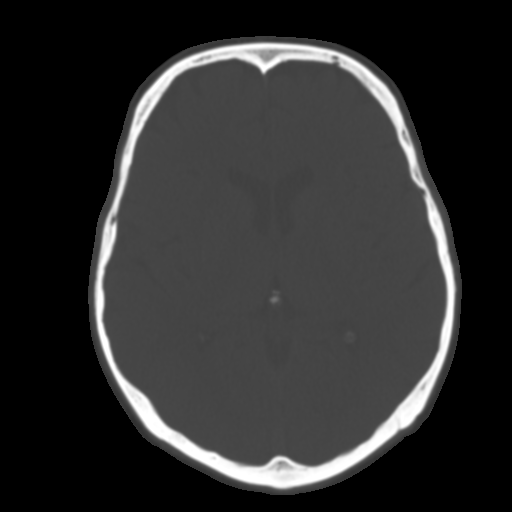
[im 17/31  brain]
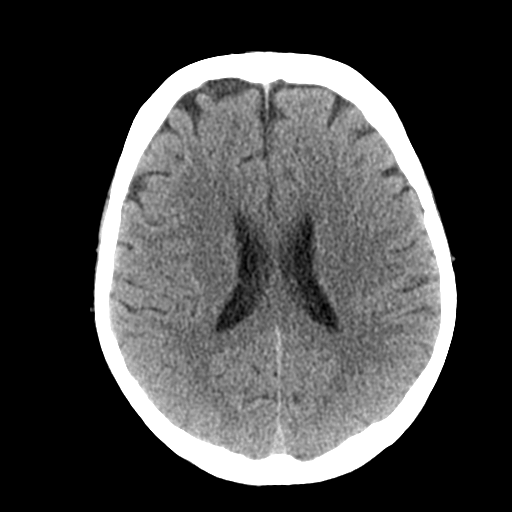
[im 20/31  brain]
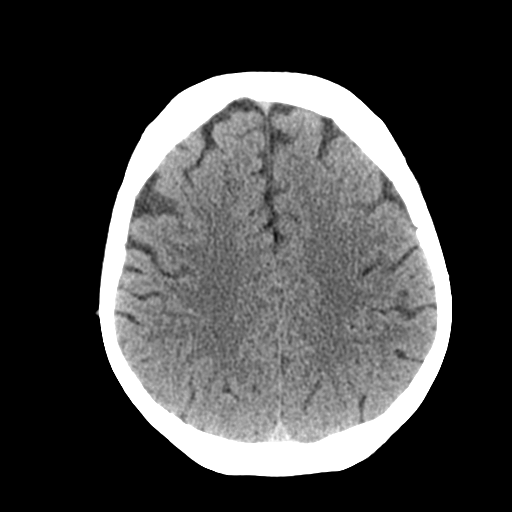
[im 23/31  brain]
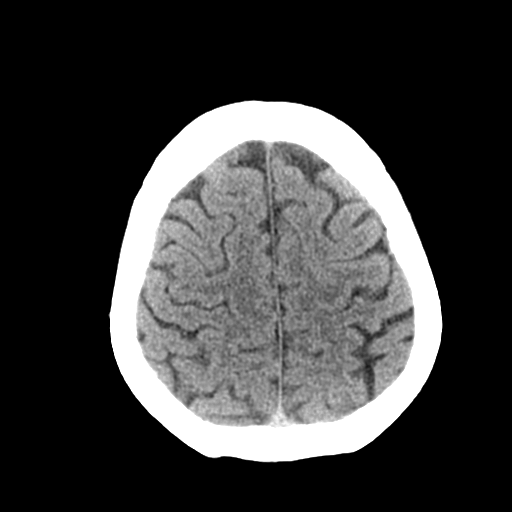
[im 25/31  brain]
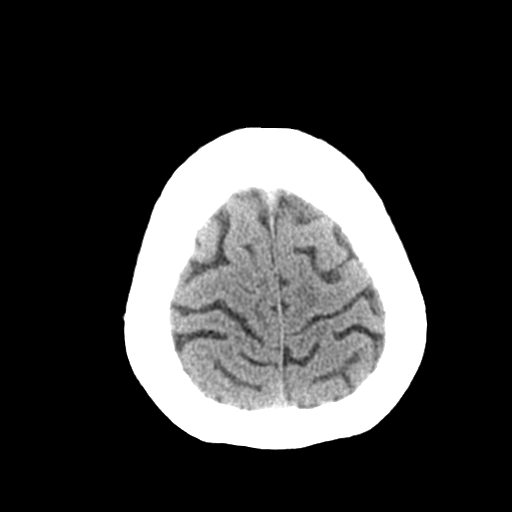
[im 25/31  bone]
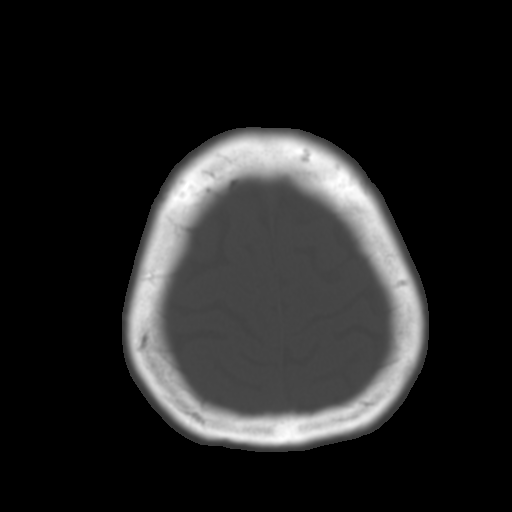
[im 28/31  brain]
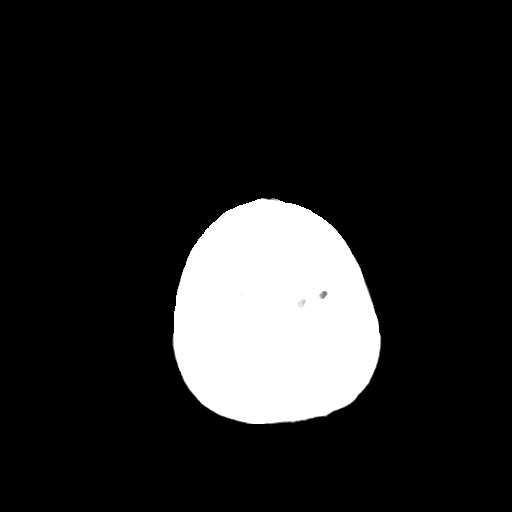

[Series 4: coronal soft tissue · coronal · 0.30mm/px · 3 of 63 slices shown]
[im 21/63  brain]
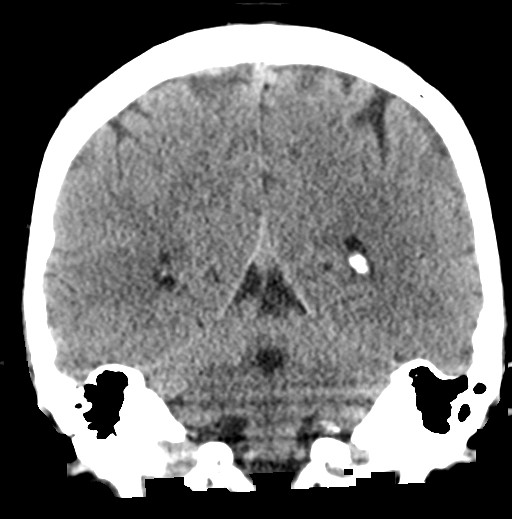
[im 28/63  brain]
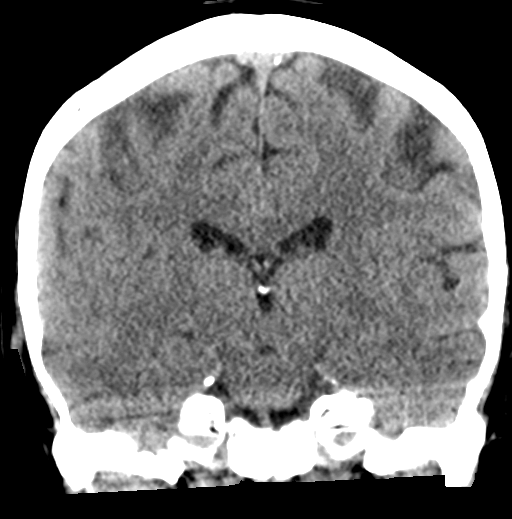
[im 35/63  brain]
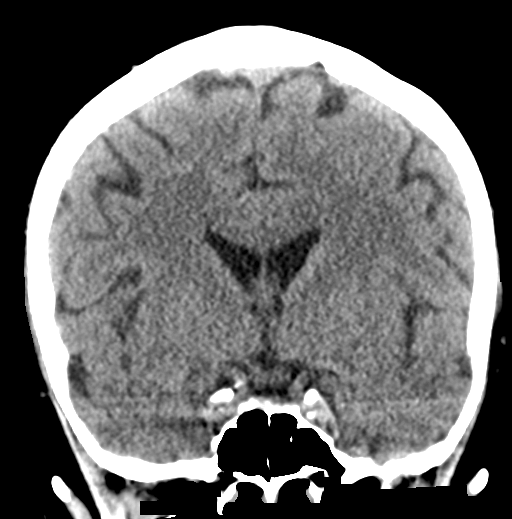

[Series 5: sagittal soft tissue · sagittal · 0.31mm/px · 3 of 52 slices shown]
[im 18/52  brain]
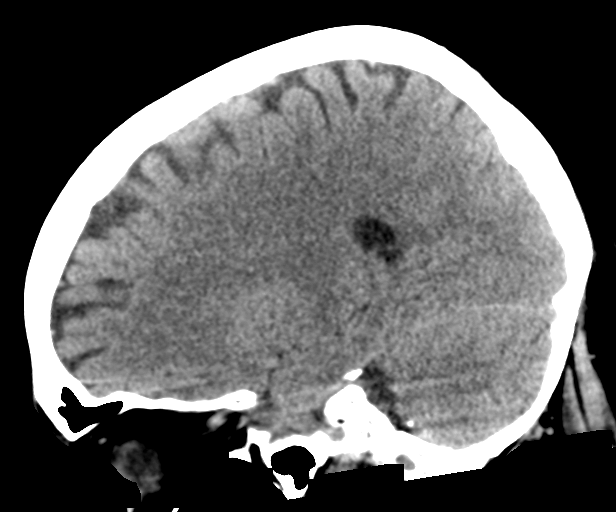
[im 26/52  brain]
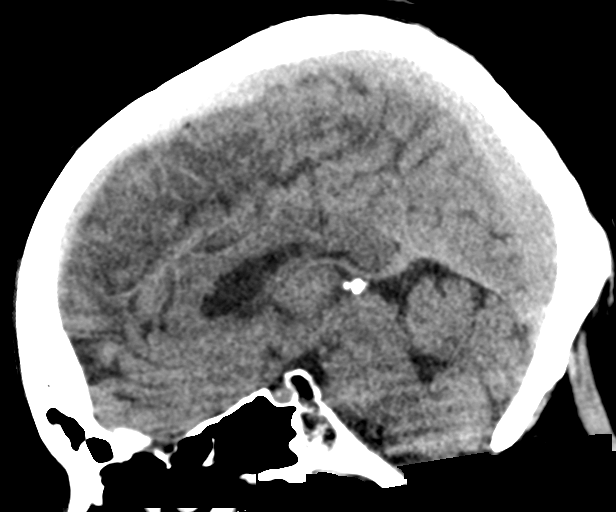
[im 35/52  brain]
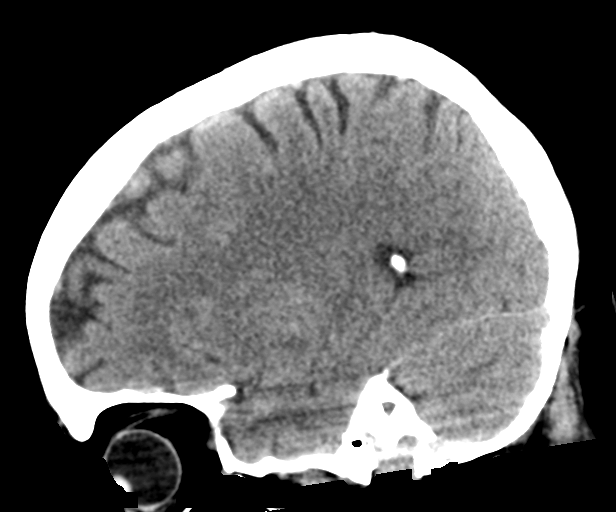

[16 of 47 positions shown; findings below may reference images not displayed]

FINDINGS: Brain: The ventricular system is normal in size and configuration,
and the septum is midline in position. The fourth ventricle and
basilar cisterns are unremarkable. No hemorrhage, mass lesion, or
acute infarction is seen.

Vascular: No vascular abnormality is seen on this unenhanced study.

Skull: No calvarial abnormality is seen.

Sinuses/Orbits: The paranasal sinuses are clear other than a
probable retention cyst in the lateral left frontal sinus.

Other: None
IMPRESSION: 1. No acute intracranial abnormality.
2. Probable retention cyst in the lateral left frontal sinus.

## 2017-12-17 ENCOUNTER — Other Ambulatory Visit: Payer: Self-pay

## 2017-12-17 ENCOUNTER — Encounter: Payer: Self-pay | Admitting: Emergency Medicine

## 2017-12-17 ENCOUNTER — Emergency Department: Payer: 59

## 2017-12-17 ENCOUNTER — Emergency Department
Admission: EM | Admit: 2017-12-17 | Discharge: 2017-12-17 | Disposition: A | Discharge status: Home / Self Care | Payer: 59 | Attending: Emergency Medicine | Admitting: Emergency Medicine

## 2017-12-17 DIAGNOSIS — Z87891 Personal history of nicotine dependence: Secondary | ICD-10-CM | POA: Diagnosis not present

## 2017-12-17 DIAGNOSIS — I700 Atherosclerosis of aorta: Secondary | ICD-10-CM

## 2017-12-17 DIAGNOSIS — R1031 Right lower quadrant pain: Secondary | ICD-10-CM | POA: Diagnosis present

## 2017-12-17 DIAGNOSIS — Z7982 Long term (current) use of aspirin: Secondary | ICD-10-CM | POA: Diagnosis not present

## 2017-12-17 DIAGNOSIS — F419 Anxiety disorder, unspecified: Secondary | ICD-10-CM | POA: Insufficient documentation

## 2017-12-17 DIAGNOSIS — Z955 Presence of coronary angioplasty implant and graft: Secondary | ICD-10-CM | POA: Diagnosis not present

## 2017-12-17 DIAGNOSIS — Z79899 Other long term (current) drug therapy: Secondary | ICD-10-CM | POA: Diagnosis not present

## 2017-12-17 DIAGNOSIS — I509 Heart failure, unspecified: Secondary | ICD-10-CM | POA: Insufficient documentation

## 2017-12-17 DIAGNOSIS — N201 Calculus of ureter: Secondary | ICD-10-CM | POA: Diagnosis not present

## 2017-12-17 DIAGNOSIS — I2510 Atherosclerotic heart disease of native coronary artery without angina pectoris: Secondary | ICD-10-CM | POA: Diagnosis not present

## 2017-12-17 DIAGNOSIS — Z950 Presence of cardiac pacemaker: Secondary | ICD-10-CM | POA: Insufficient documentation

## 2017-12-17 DIAGNOSIS — I110 Hypertensive heart disease with heart failure: Secondary | ICD-10-CM | POA: Insufficient documentation

## 2017-12-17 LAB — CBC
HCT: 40.9 % (ref 40.0–52.0)
HEMOGLOBIN: 14.1 g/dL (ref 13.0–18.0)
MCH: 29.7 pg (ref 26.0–34.0)
MCHC: 34.5 g/dL (ref 32.0–36.0)
MCV: 86.1 fL (ref 80.0–100.0)
Platelets: 205 10*3/uL (ref 150–440)
RBC: 4.75 MIL/uL (ref 4.40–5.90)
RDW: 14.6 % — ABNORMAL HIGH (ref 11.5–14.5)
WBC: 6.9 10*3/uL (ref 3.8–10.6)

## 2017-12-17 LAB — BASIC METABOLIC PANEL
Anion gap: 8 (ref 5–15)
BUN: 11 mg/dL (ref 8–23)
CHLORIDE: 104 mmol/L (ref 98–111)
CO2: 27 mmol/L (ref 22–32)
Calcium: 8.8 mg/dL — ABNORMAL LOW (ref 8.9–10.3)
Creatinine, Ser: 1.36 mg/dL — ABNORMAL HIGH (ref 0.61–1.24)
GFR calc Af Amer: >60 mL/min (ref >60)
GFR calc non Af Amer: 55 mL/min — ABNORMAL LOW (ref >60)
Glucose, Bld: 110 mg/dL — ABNORMAL HIGH (ref 70–99)
Potassium: 3.9 mmol/L (ref 3.5–5.1)
SODIUM: 139 mmol/L (ref 135–145)

## 2017-12-17 LAB — URINALYSIS, COMPLETE (UACMP) WITH MICROSCOPIC
Bilirubin Urine: NEGATIVE
Glucose, UA: NEGATIVE mg/dL
Ketones, ur: NEGATIVE mg/dL
Leukocytes, UA: NEGATIVE
NITRITE: NEGATIVE
Protein, ur: 30 mg/dL — AB
RBC / HPF: >50 RBC/hpf — ABNORMAL HIGH (ref 0–5)
SPECIFIC GRAVITY, URINE: 1.016 (ref 1.005–1.030)
Squamous Epithelial / LPF: NONE SEEN (ref 0–5)
pH: 6 (ref 5.0–8.0)

## 2017-12-17 MED ORDER — OXYCODONE-ACETAMINOPHEN 5-325 MG PO TABS: 1.0000 | ORAL_TABLET | Freq: Four times a day (QID) | ORAL | 0 refills | Status: DC | PRN

## 2017-12-17 MED ORDER — KETOROLAC TROMETHAMINE 60 MG/2ML IM SOLN
15.0000 mg | Freq: Once | INTRAMUSCULAR | Status: AC
  Administered 2017-12-17: 15 mg via INTRAMUSCULAR

## 2017-12-17 MED ORDER — TAMSULOSIN HCL 0.4 MG PO CAPS: 0.4000 mg | ORAL_CAPSULE | Freq: Every day | ORAL | 0 refills | Status: DC

## 2017-12-17 MED ORDER — TAMSULOSIN HCL 0.4 MG PO CAPS
0.4000 mg | ORAL_CAPSULE | ORAL | Status: AC
  Administered 2017-12-17: 0.4 mg via ORAL
  Filled 2017-12-17 (×2): qty 1

## 2017-12-17 MED ORDER — NAPROXEN 500 MG PO TABS: 500.0000 mg | ORAL_TABLET | Freq: Two times a day (BID) | ORAL | 0 refills | Status: DC

## 2017-12-17 MED ORDER — KETOROLAC TROMETHAMINE 30 MG/ML IJ SOLN
INTRAMUSCULAR | Status: DC
Start: 2017-12-17 — End: 2017-12-18
  Filled 2017-12-17: qty 1

## 2017-12-17 NOTE — ED Notes (Signed)
Pt reports he has been feeling cold not sure if running a fever at home

## 2017-12-17 NOTE — ED Provider Notes (Addendum)
[facility] Emergency Department Provider Note  ____________________________________________  Time seen: Approximately 10:20 PM  I have reviewed the triage vital signs and the nursing notes.   HISTORY  Chief Complaint Flank Pain    HPI Elijah [name] is a 62 y.o. male with a history of acid reflux, CAD, kidney stones who complains of right flank pain that started at 2 PM today.  Constant, waxing waning, severe, sharp and aching, radiating to the right lower quadrant, no aggravating or alleviating factors.  No dysuria frequency urgency hematuria.  No fevers or sweats.  Normal oral intake.  Took a Percocet at home which is improving the pain.      Past Medical History:  Diagnosis Date  . Acid reflux   . Anemia   . Anginal pain (HCC)   . Anxiety   . CHF (congestive heart failure) (HCC)   . Coronary artery disease   . Heart attack [facility](HCC) December 2012  . History of panic attacks   . Hyperlipidemia   . Kidney stones   . Presence of permanent cardiac pacemaker   . Restless leg syndrome   . Shortness of breath dyspnea      Patient Active Problem List   Diagnosis Date Noted  . BPH (benign prostatic hyperplasia) 09/10/2015  . Kidney stones 09/10/2015  . Gross hematuria 09/10/2015  . Flank pain 09/10/2015  . 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 Nephrolithiasis 03/02/2015  . 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 Episode of syncope 11/25/2013  . Dizziness 11/21/2013  . CCF (congestive cardiac failure) (HCC) 11/21/2013  . Congestive heart failure (HCC) 11/21/2013  . Acid reflux 03/22/2012  . Essential (primary) hypertension 03/22/2012  . Arteriosclerosis of coronary artery 03/22/2012     Past Surgical History:  Procedure Laterality Date  . CARDIAC CATHETERIZATION    . CORONARY ANGIOPLASTY    . CORONARY STENT PLACEMENT    . CYSTOSCOPY W/ RETROGRADES Left 03/16/2015   Procedure: CYSTOSCOPY WITH  RETROGRADE PYELOGRAM;  Surgeon: Crist FatBenjamin W Herrick, MD;  Location: [facility] ORS;  Service: Urology;  Laterality: Left;  . CYSTOSCOPY WITH STENT PLACEMENT Left 03/16/2015   Procedure: CYSTOSCOPY WITH STENT PLACEMENT;  Surgeon: Crist FatBenjamin W Herrick, MD;  Location: [facility] ORS;  Service: Urology;  Laterality: Left;  . CYSTOSCOPY/URETEROSCOPY/HOLMIUM LASER/STENT PLACEMENT Left 03/16/2015   Procedure: CYSTOSCOPY/URETEROSCOPY/HOLMIUM LASER/STENT PLACEMENT;  Surgeon: Crist FatBenjamin W Herrick, MD;  Location: [facility] ORS;  Service: Urology;  Laterality: Left;  . EXTRACORPOREAL SHOCK WAVE LITHOTRIPSY    . INSERT / REPLACE / REMOVE PACEMAKER    . PACEMAKER INSERTION       Prior to Admission medications   Medication Sig Start Date End Date Taking? Authorizing Provider  aspirin EC 81 MG tablet Take 81 mg by mouth daily.     [provider]  atorvastatin (LIPITOR) 40 MG tablet Take 40 mg by mouth daily at 6 PM.  02/03/15   [provider]  EFFIENT 10 MG TABS tablet Take 10 mg by mouth daily. Reported on 09/09/2015 01/08/15   [provider]  metoprolol tartrate (LOPRESSOR) 25 MG tablet Take 1 tablet (25 mg total) by mouth 2 (two) times daily. 05/04/16 05/04/17  Governor RooksLord, Rebecca, MD  Multiple Vitamin (MULTIVITAMIN) tablet Take 1 tablet by mouth daily.    [provider]  naproxen (NAPROSYN) 500 MG tablet Take 1 tablet (500 mg total) by mouth 2 (two) times daily with a meal. 12/17/17   Sharman CheekStafford, Candon, MD  omeprazole (PRILOSEC OTC)  20 MG tablet Take 20 mg by mouth daily.    [provider]  oxyCODONE-acetaminophen (PERCOCET) 5-325 MG tablet Take 1 tablet by mouth every 4 (four) hours as needed for severe pain. 06/23/16   Tommi Rumps, PA-C  RANEXA 1000 MG SR tablet Take 1,000 mg by mouth 2 (two) times daily.  01/07/15   [provider]  ranitidine (ZANTAC) 75 MG tablet Take 75 mg by mouth 2 (two) times daily.    [provider]  spironolactone (ALDACTONE) 25 MG tablet  Take 25 mg by mouth daily.  01/07/15   [provider]  tamsulosin (FLOMAX) 0.4 MG CAPS capsule Take 1 capsule (0.4 mg total) by mouth daily. 12/17/17   Sharman Cheek, MD  vitamin B-12 (CYANOCOBALAMIN) 1000 MCG tablet Take 1 tablet by mouth daily.    [provider]     Allergies Hydrocodone-acetaminophen; Morphine; and Sulfa antibiotics   Family History  Problem Relation Age of Onset  . Emphysema Father   . Urolithiasis Neg Hx   . Prostate cancer Neg Hx   . Kidney disease Neg Hx   . Kidney cancer Neg Hx     Social History Social History   Tobacco Use  . Smoking status: Former Smoker    Packs/day: 2.00    Types: Cigarettes    Last attempt to quit: 04/06/2011    Years since quitting: 6.7  . Smokeless tobacco: Never Used  Substance Use Topics  . Alcohol use: No    Alcohol/week: 0.0 oz  . Drug use: No    Review of Systems  Constitutional:   No fever or chills.  ENT:   No sore throat. No rhinorrhea. Cardiovascular:   No chest pain or syncope. Respiratory:   No dyspnea or cough. Gastrointestinal:   Right flank pain as above without vomiting and diarrhea.  Musculoskeletal:   Negative for focal pain or swelling All other systems reviewed and are negative except as documented above in ROS and HPI.  ____________________________________________   PHYSICAL EXAM:  VITAL SIGNS: ED Triage Vitals  Enc Vitals Group     BP 12/17/17 1908 (!) 141/72     Pulse Rate 12/17/17 1908 62     Resp 12/17/17 1908 20     Temp 12/17/17 1908 97.6 F (36.4 C)     Temp Source 12/17/17 1908 Oral     SpO2 12/17/17 1908 98 %     Weight 12/17/17 1909 185 lb (83.9 kg)     Height 12/17/17 1909 5\' 7"  (1.702 m)     Head Circumference --      Peak Flow --      Pain Score 12/17/17 1909 5     Pain Loc --      Pain Edu? --      Excl. in GC? --     Vital signs reviewed, nursing assessments reviewed.   Constitutional:   Alert and oriented. Non-toxic appearance. Eyes:    Conjunctivae are normal. EOMI. PERRL. ENT      Head:   Normocephalic and atraumatic.      Nose:   No congestion/rhinnorhea.       Mouth/Throat:   MMM, no pharyngeal erythema. No peritonsillar mass.       Neck:   No meningismus. Full ROM. Hematological/Lymphatic/Immunilogical:   No cervical lymphadenopathy. Cardiovascular:   RRR. Symmetric bilateral radial and DP pulses.  No murmurs. Cap refill less than 2 seconds. Respiratory:   Normal respiratory effort without tachypnea/retractions. Breath sounds are clear  and equal bilaterally. No wheezes/rales/rhonchi. Gastrointestinal:   Soft and nontender. Non distended. There is no CVA tenderness.  No rebound, rigidity, or guarding. Musculoskeletal:   Normal range of motion in all extremities. No joint effusions.  No lower extremity tenderness.  No edema. Neurologic:   Normal speech and language.  Motor grossly intact. No acute focal neurologic deficits are appreciated.  Skin:    Skin is warm, dry and intact. No rash noted.  No petechiae, purpura, or bullae.  ____________________________________________    LABS (pertinent positives/negatives) (all labs ordered are listed, but only abnormal results are displayed) Labs Reviewed  URINALYSIS, COMPLETE (UACMP) WITH MICROSCOPIC - Abnormal; Notable for the following components:      Result Value   Color, Urine YELLOW (*)    APPearance CLEAR (*)    Hgb urine dipstick LARGE (*)    Protein, ur 30 (*)    RBC / HPF >50 (*)    Bacteria, UA RARE (*)    All other components within normal limits  BASIC METABOLIC PANEL - Abnormal; Notable for the following components:   Glucose, Bld 110 (*)    Creatinine, Ser 1.36 (*)    Calcium 8.8 (*)    GFR calc non Af Amer 55 (*)    All other components within normal limits  CBC - Abnormal; Notable for the following components:   RDW 14.6 (*)    All other components within normal limits    ____________________________________________   EKG    ____________________________________________    RADIOLOGY  Ct Renal Stone Study  Result Date: 12/17/2017 CLINICAL DATA:  62 year old male with right flank pain. Concern for stone disease. EXAM: CT ABDOMEN AND PELVIS WITHOUT CONTRAST TECHNIQUE: Multidetector CT imaging of the abdomen and pelvis was performed following the standard protocol without IV contrast. COMPARISON:  CT of the abdomen pelvis dated 02/27/2015 and renal ultrasound dated 03/23/2015 FINDINGS: Evaluation of this exam is limited in the absence of intravenous contrast. Lower chest: The visualized lung bases are clear. Coronary vascular calcification and the cardiac AICD lead noted. No intra-abdominal free air or free fluid. Hepatobiliary: Multiple small hepatic hypodense lesions are not well characterized but appears similar to prior CT possibly cysts or hemangioma. The liver is otherwise unremarkable. No intrahepatic biliary ductal dilatation. The gallbladder is unremarkable. Pancreas: Unremarkable. No pancreatic ductal dilatation or surrounding inflammatory changes. Spleen: Normal in size without focal abnormality. Adrenals/Urinary Tract: The adrenal glands are unremarkable. There is a 5 mm stone in the proximal right ureter with mild right hydronephrosis. Small nonobstructing bilateral renal calculi primarily involving the right kidney noted. There is a 1 cm nonobstructing linear stone in the upper pole of the right kidney. There is no hydronephrosis on the left. The left ureter and urinary bladder appear unremarkable. Stomach/Bowel: There is a 2 cm duodenal diverticulum. There is no bowel obstruction or active inflammation. Multiple normal caliber fecalized loops of small bowel may represent increased transit time or small intestine bacterial overgrowth. Clinical correlation is recommended. The appendix is normal. Vascular/Lymphatic: There is moderate aortoiliac  atherosclerotic disease. No portal venous gas. There is no adenopathy. Reproductive: The prostate and seminal vesicles are grossly unremarkable. No pelvic mass. Other: None Musculoskeletal: No acute or significant osseous findings. IMPRESSION: 1. A 5 mm proximal right ureteral calculus with mild right hydronephrosis. Additional nonobstructing bilateral renal calculi noted. 2. No bowel obstruction or active inflammation.  Normal appendix. 3.  Aortic Atherosclerosis (ICD10-I70.0). Electronically Signed   By: Elgie Collard M.D.   On:  12/17/2017 21:50    ____________________________________________   PROCEDURES Procedures  ____________________________________________  DIFFERENTIAL DIAGNOSIS   Appendicitis, ureterolithiasis  CLINICAL IMPRESSION / ASSESSMENT AND PLAN / ED COURSE  Pertinent labs & imaging results that were available during my care of the patient were reviewed by me and considered in my medical decision making (see chart for details).    Patient not in distress, unremarkable vital signs, doubt cystitis or pyelonephritis.  Doubt perforation obstruction biliary disease colitis pancreatitis diverticulitis AAA or dissection.  CT scan obtained to evaluate for stone versus appendicitis, unremarkable appendix, 5 mm proximal right ureteral stone expanding the symptoms.  Continue NSAIDs, Percocet as needed, Flomax, follow-up with primary care, follow-up with urology as needed.  Return precautions.       ----------------------------------------- 10:26 PM on 12/17/2017 -----------------------------------------  I will provide the patient a short prescription for Percocet as well for improved pain control.  Very low risk according to controlled substance reporting system. ____________________________________________   FINAL CLINICAL IMPRESSION(S) / ED DIAGNOSES    Final diagnoses:  Ureterolithiasis  Aortic atherosclerosis (HCC)  Right flank pain   ED Discharge Orders         Ordered    naproxen (NAPROSYN) 500 MG tablet  2 times daily with meals     12/17/17 2219    tamsulosin (FLOMAX) 0.4 MG CAPS capsule  Daily     12/17/17 2219      Portions of this note were generated with dragon dictation software. Dictation errors may occur despite best attempts at proofreading.    Sharman Cheek, MD 12/17/17 Deirdre Evener    Sharman Cheek, MD 12/17/17 2227

## 2017-12-17 NOTE — ED Triage Notes (Signed)
Pt presents to triage room ambulatory no distress noted, reports right flank since this afternoon around 2pm. Reports history of kidney stones, reports took a percocet at 1500. Pt denies any other symptoms at presents

## 2017-12-17 NOTE — ED Notes (Signed)
Pt states noticed R flank pain about 2pm today. Denies hematuria. Hx kidney stones. Drinks sodas. States urine looked dark today. Alert, oriented, ambulatory. Family with pt. No distress noted at this time.

## 2017-12-17 NOTE — Discharge Instructions (Addendum)
Your CT scan shows a 5mm stone in the Right ureter, just outside the kidney.  This may take several days to pass into the bladder, at which point your symptoms will get much better. Follow up with your doctor for continued monitoring of your symptoms. Return to the ER if you have new or worsening symptoms including fever, uncontrollable pain, or inability to urinate.

## 2018-02-20 ENCOUNTER — Emergency Department: Payer: 59

## 2018-02-20 ENCOUNTER — Encounter: Payer: Self-pay | Admitting: Emergency Medicine

## 2018-02-20 ENCOUNTER — Other Ambulatory Visit: Payer: Self-pay

## 2018-02-20 ENCOUNTER — Observation Stay
Admission: EM | Admit: 2018-02-20 | Discharge: 2018-02-21 | DRG: 660 | Disposition: A | Discharge status: Home / Self Care | Payer: 59 | Attending: Internal Medicine | Admitting: Internal Medicine

## 2018-02-20 DIAGNOSIS — Z7902 Long term (current) use of antithrombotics/antiplatelets: Secondary | ICD-10-CM | POA: Insufficient documentation

## 2018-02-20 DIAGNOSIS — R109 Unspecified abdominal pain: Secondary | ICD-10-CM | POA: Diagnosis not present

## 2018-02-20 DIAGNOSIS — I2510 Atherosclerotic heart disease of native coronary artery without angina pectoris: Secondary | ICD-10-CM | POA: Diagnosis not present

## 2018-02-20 DIAGNOSIS — Z950 Presence of cardiac pacemaker: Secondary | ICD-10-CM | POA: Insufficient documentation

## 2018-02-20 DIAGNOSIS — K219 Gastro-esophageal reflux disease without esophagitis: Secondary | ICD-10-CM | POA: Insufficient documentation

## 2018-02-20 DIAGNOSIS — Z955 Presence of coronary angioplasty implant and graft: Secondary | ICD-10-CM | POA: Diagnosis not present

## 2018-02-20 DIAGNOSIS — I130 Hypertensive heart and chronic kidney disease with heart failure and stage 1 through stage 4 chronic kidney disease, or unspecified chronic kidney disease: Secondary | ICD-10-CM | POA: Diagnosis not present

## 2018-02-20 DIAGNOSIS — N179 Acute kidney failure, unspecified: Secondary | ICD-10-CM | POA: Diagnosis not present

## 2018-02-20 DIAGNOSIS — D631 Anemia in chronic kidney disease: Secondary | ICD-10-CM | POA: Insufficient documentation

## 2018-02-20 DIAGNOSIS — N201 Calculus of ureter: Secondary | ICD-10-CM

## 2018-02-20 DIAGNOSIS — E785 Hyperlipidemia, unspecified: Secondary | ICD-10-CM | POA: Diagnosis not present

## 2018-02-20 DIAGNOSIS — Z419 Encounter for procedure for purposes other than remedying health state, unspecified: Secondary | ICD-10-CM

## 2018-02-20 DIAGNOSIS — N200 Calculus of kidney: Secondary | ICD-10-CM

## 2018-02-20 DIAGNOSIS — N132 Hydronephrosis with renal and ureteral calculous obstruction: Principal | ICD-10-CM | POA: Insufficient documentation

## 2018-02-20 DIAGNOSIS — I252 Old myocardial infarction: Secondary | ICD-10-CM | POA: Diagnosis not present

## 2018-02-20 DIAGNOSIS — Z87891 Personal history of nicotine dependence: Secondary | ICD-10-CM | POA: Insufficient documentation

## 2018-02-20 DIAGNOSIS — N400 Enlarged prostate without lower urinary tract symptoms: Secondary | ICD-10-CM | POA: Diagnosis not present

## 2018-02-20 DIAGNOSIS — Z791 Long term (current) use of non-steroidal anti-inflammatories (NSAID): Secondary | ICD-10-CM | POA: Diagnosis not present

## 2018-02-20 DIAGNOSIS — I5022 Chronic systolic (congestive) heart failure: Secondary | ICD-10-CM | POA: Insufficient documentation

## 2018-02-20 DIAGNOSIS — Z7982 Long term (current) use of aspirin: Secondary | ICD-10-CM | POA: Diagnosis not present

## 2018-02-20 DIAGNOSIS — Z79899 Other long term (current) drug therapy: Secondary | ICD-10-CM | POA: Diagnosis not present

## 2018-02-20 DIAGNOSIS — Z87442 Personal history of urinary calculi: Secondary | ICD-10-CM | POA: Diagnosis not present

## 2018-02-20 DIAGNOSIS — N183 Chronic kidney disease, stage 3 (moderate): Secondary | ICD-10-CM | POA: Diagnosis not present

## 2018-02-20 LAB — BASIC METABOLIC PANEL
ANION GAP: 8 (ref 5–15)
BUN: 14 mg/dL (ref 8–23)
CALCIUM: 9.3 mg/dL (ref 8.9–10.3)
CO2: 28 mmol/L (ref 22–32)
Chloride: 105 mmol/L (ref 98–111)
Creatinine, Ser: 1.89 mg/dL — ABNORMAL HIGH (ref 0.61–1.24)
GFR calc non Af Amer: 37 mL/min — ABNORMAL LOW (ref >60)
GFR, EST AFRICAN AMERICAN: 43 mL/min — AB (ref >60)
GLUCOSE: 95 mg/dL (ref 70–99)
Potassium: 3.9 mmol/L (ref 3.5–5.1)
Sodium: 141 mmol/L (ref 135–145)

## 2018-02-20 LAB — CBC WITH DIFFERENTIAL/PLATELET
BASOS ABS: 0 10*3/uL (ref 0–0.1)
Basophils Relative: 1 %
Eosinophils Absolute: 0.1 10*3/uL (ref 0–0.7)
Eosinophils Relative: 1 %
HEMATOCRIT: 41.6 % (ref 40.0–52.0)
Hemoglobin: 14.4 g/dL (ref 13.0–18.0)
LYMPHS PCT: 29 %
Lymphs Abs: 2 10*3/uL (ref 1.0–3.6)
MCH: 30.3 pg (ref 26.0–34.0)
MCHC: 34.7 g/dL (ref 32.0–36.0)
MCV: 87.2 fL (ref 80.0–100.0)
MONO ABS: 0.7 10*3/uL (ref 0.2–1.0)
MONOS PCT: 9 %
NEUTROS ABS: 4.3 10*3/uL (ref 1.4–6.5)
Neutrophils Relative %: 60 %
Platelets: 209 10*3/uL (ref 150–440)
RBC: 4.77 MIL/uL (ref 4.40–5.90)
RDW: 14.1 % (ref 11.5–14.5)
WBC: 7.2 10*3/uL (ref 3.8–10.6)

## 2018-02-20 LAB — URINALYSIS, COMPLETE (UACMP) WITH MICROSCOPIC
BACTERIA UA: NONE SEEN
BILIRUBIN URINE: NEGATIVE
Glucose, UA: NEGATIVE mg/dL
KETONES UR: NEGATIVE mg/dL
LEUKOCYTES UA: NEGATIVE
NITRITE: NEGATIVE
PROTEIN: NEGATIVE mg/dL
SPECIFIC GRAVITY, URINE: 1.03 (ref 1.005–1.030)
SQUAMOUS EPITHELIAL / LPF: NONE SEEN (ref 0–5)
WBC UA: NONE SEEN WBC/hpf (ref 0–5)
pH: 5 (ref 5.0–8.0)

## 2018-02-20 LAB — SURGICAL PCR SCREEN
MRSA, PCR: NEGATIVE
Staphylococcus aureus: NEGATIVE

## 2018-02-20 MED ORDER — HYDROMORPHONE HCL 1 MG/ML IJ SOLN
0.5000 mg | Freq: Once | INTRAMUSCULAR | Status: AC
  Administered 2018-02-20: 0.5 mg via INTRAVENOUS
  Filled 2018-02-20: qty 1

## 2018-02-20 MED ORDER — TAMSULOSIN HCL 0.4 MG PO CAPS
0.4000 mg | ORAL_CAPSULE | Freq: Every day | ORAL | Status: DC
  Administered 2018-02-20: 0.4 mg via ORAL
  Filled 2018-02-20: qty 1

## 2018-02-20 MED ORDER — MUPIROCIN 2 % EX OINT
1.0000 "application " | TOPICAL_OINTMENT | Freq: Two times a day (BID) | CUTANEOUS | Status: DC
  Filled 2018-02-20: qty 22

## 2018-02-20 MED ORDER — SODIUM CHLORIDE 0.9 % IV SOLN
INTRAVENOUS | Status: DC
  Administered 2018-02-20: 13:00:00 via INTRAVENOUS

## 2018-02-20 MED ORDER — ADULT MULTIVITAMIN W/MINERALS CH: 1.0000 | ORAL_TABLET | Freq: Every day | ORAL | Status: DC

## 2018-02-20 MED ORDER — RANOLAZINE ER 500 MG PO TB12
1000.0000 mg | ORAL_TABLET | Freq: Two times a day (BID) | ORAL | Status: DC
  Administered 2018-02-20 (×2): 1000 mg via ORAL
  Filled 2018-02-20 (×4): qty 2

## 2018-02-20 MED ORDER — PROMETHAZINE HCL 25 MG/ML IJ SOLN: 12.5000 mg | Freq: Four times a day (QID) | INTRAMUSCULAR | Status: DC | PRN

## 2018-02-20 MED ORDER — METOPROLOL TARTRATE 25 MG PO TABS
25.0000 mg | ORAL_TABLET | Freq: Two times a day (BID) | ORAL | Status: DC
  Administered 2018-02-20 (×2): 25 mg via ORAL
  Filled 2018-02-20 (×2): qty 1

## 2018-02-20 MED ORDER — ONDANSETRON HCL 4 MG/2ML IJ SOLN
4.0000 mg | Freq: Once | INTRAMUSCULAR | Status: AC
  Administered 2018-02-20: 4 mg via INTRAVENOUS
  Filled 2018-02-20: qty 2

## 2018-02-20 MED ORDER — HYDROMORPHONE HCL 1 MG/ML IJ SOLN
1.0000 mg | INTRAMUSCULAR | Status: DC | PRN
Start: 2018-02-20 — End: 2018-02-21

## 2018-02-20 MED ORDER — ACETAMINOPHEN 650 MG RE SUPP: 650.0000 mg | Freq: Four times a day (QID) | RECTAL | Status: DC | PRN

## 2018-02-20 MED ORDER — SPIRONOLACTONE 25 MG PO TABS: 25.0000 mg | ORAL_TABLET | Freq: Every day | ORAL | Status: DC

## 2018-02-20 MED ORDER — VITAMIN B-12 1000 MCG PO TABS: 1000.0000 ug | ORAL_TABLET | Freq: Every day | ORAL | Status: DC

## 2018-02-20 MED ORDER — SODIUM CHLORIDE 0.9 % IV BOLUS
1000.0000 mL | Freq: Once | INTRAVENOUS | Status: AC
  Administered 2018-02-20: 1000 mL via INTRAVENOUS

## 2018-02-20 MED ORDER — PROMETHAZINE HCL 25 MG/ML IJ SOLN
12.5000 mg | Freq: Once | INTRAMUSCULAR | Status: AC
  Administered 2018-02-20: 12.5 mg via INTRAVENOUS

## 2018-02-20 MED ORDER — ONDANSETRON HCL 4 MG/2ML IJ SOLN
4.0000 mg | Freq: Once | INTRAMUSCULAR | Status: AC
Start: 2018-02-20 — End: 2018-02-20
  Administered 2018-02-20: 4 mg via INTRAVENOUS
  Filled 2018-02-20: qty 2

## 2018-02-20 MED ORDER — PANTOPRAZOLE SODIUM 40 MG PO TBEC
40.0000 mg | DELAYED_RELEASE_TABLET | Freq: Every day | ORAL | Status: DC
  Administered 2018-02-20: 40 mg via ORAL
  Filled 2018-02-20 (×2): qty 1

## 2018-02-20 MED ORDER — FAMOTIDINE 20 MG PO TABS
20.0000 mg | ORAL_TABLET | Freq: Two times a day (BID) | ORAL | Status: DC
  Administered 2018-02-20 – 2018-02-21 (×2): 20 mg via ORAL
  Filled 2018-02-20 (×2): qty 1

## 2018-02-20 MED ORDER — ATORVASTATIN CALCIUM 20 MG PO TABS
80.0000 mg | ORAL_TABLET | Freq: Every day | ORAL | Status: DC
  Administered 2018-02-20: 80 mg via ORAL
  Filled 2018-02-20: qty 4

## 2018-02-20 MED ORDER — PROMETHAZINE HCL 25 MG/ML IJ SOLN
INTRAMUSCULAR | Status: AC
  Filled 2018-02-20: qty 1

## 2018-02-20 MED ORDER — HYDROMORPHONE HCL 1 MG/ML IJ SOLN
1.0000 mg | Freq: Once | INTRAMUSCULAR | Status: AC
  Administered 2018-02-20: 1 mg via INTRAVENOUS
  Filled 2018-02-20: qty 1

## 2018-02-20 MED ORDER — ACETAMINOPHEN 325 MG PO TABS
650.0000 mg | ORAL_TABLET | Freq: Four times a day (QID) | ORAL | Status: DC | PRN
  Administered 2018-02-20: 650 mg via ORAL
  Filled 2018-02-20: qty 2

## 2018-02-20 NOTE — ED Triage Notes (Signed)
Patient ambulatory to triage with steady gait, without difficulty or distress noted; pt reports rt flank pain since Saturday accomp by nausea; st hx kidney stones

## 2018-02-20 NOTE — H&P (Signed)
[facility] [place] [facility]   PATIENT NAME: Elijah [name]    MR#:  [id number]  DATE OF BIRTH:  Jul 21, 1955  DATE OF ADMISSION:  02/20/2018  PRIMARY CARE PHYSICIAN: Marisue Ivan, MD   REQUESTING/REFERRING PHYSICIAN: Dr Jene Every  CHIEF COMPLAINT:   Chief Complaint  Patient presents with  . Flank Pain    HISTORY OF PRESENT ILLNESS:  Elijah [name]  is a 62 y.o. male with a known history of kidney stones presents with right flank pain.  He states the pain has been going on and off since Friday.  Last night got really worse.  Pain described as high as 8-9 out of 10 in intensity.  After pain medications it went down to 1-2 out of 10 in intensity.  He stated he had a fever last night of 99.5.  He vomited last night and again this morning.  In the ER, he was found to have a large kidney stone and a smaller kidney stone.  Urology was consulted from the ER.  PAST MEDICAL HISTORY:   Past Medical History:  Diagnosis Date  . Acid reflux   . Anemia   . Anginal pain (HCC)   . Anxiety   . CHF (congestive heart failure) (HCC)   . Coronary artery disease   . Heart attack [facility]) December 2012  . History of panic attacks   . Hyperlipidemia   . Kidney stones   . Presence of permanent cardiac pacemaker   . Restless leg syndrome   . Shortness of breath dyspnea     PAST SURGICAL HISTORY:   Past Surgical History:  Procedure Laterality Date  . CARDIAC CATHETERIZATION    . CORONARY ANGIOPLASTY    . CORONARY STENT PLACEMENT    . CYSTOSCOPY W/ RETROGRADES Left 03/16/2015   Procedure: CYSTOSCOPY WITH RETROGRADE PYELOGRAM;  Surgeon: Crist Fat, MD;  Location: [facility] ORS;  Service: Urology;  Laterality: Left;  . CYSTOSCOPY WITH STENT PLACEMENT Left 03/16/2015   Procedure: CYSTOSCOPY WITH STENT PLACEMENT;  Surgeon: Crist Fat, MD;  Location: [facility] ORS;  Service: Urology;  Laterality: Left;  . CYSTOSCOPY/URETEROSCOPY/HOLMIUM LASER/STENT  PLACEMENT Left 03/16/2015   Procedure: CYSTOSCOPY/URETEROSCOPY/HOLMIUM LASER/STENT PLACEMENT;  Surgeon: Crist Fat, MD;  Location: [facility] ORS;  Service: Urology;  Laterality: Left;  . EXTRACORPOREAL SHOCK WAVE LITHOTRIPSY    . INSERT / REPLACE / REMOVE PACEMAKER    . PACEMAKER INSERTION      SOCIAL HISTORY:   Social History   Tobacco Use  . Smoking status: Former Smoker    Packs/day: 2.00    Types: Cigarettes    Last attempt to quit: 04/06/2011    Years since quitting: 6.8  . Smokeless tobacco: Never Used  Substance Use Topics  . Alcohol use: No    Alcohol/week: 0.0 standard drinks    FAMILY HISTORY:   Family History  Problem Relation Age of Onset  . CAD Mother   . Emphysema Father   . Urolithiasis Neg Hx   . Prostate cancer Neg Hx   . Kidney disease Neg Hx   . Kidney cancer Neg Hx     DRUG ALLERGIES:   Allergies  Allergen Reactions  . Hydrocodone-Acetaminophen Nausea And Vomiting    Causes vomiting and GI upset Causes vomiting and GI upset  . Morphine Nausea And Vomiting    Causes vomiting and GI upset  . Sulfa Antibiotics Rash    REVIEW OF SYSTEMS:  CONSTITUTIONAL: Positive for chills and fever.  Positive for  weight loss.  Positive for fatigue.  EYES: No blurred or double vision.  EARS, NOSE, AND THROAT: No tinnitus or ear pain. No sore throat.  Positive for dysphasia. RESPIRATORY: No cough, shortness of breath, wheezing or hemoptysis.  CARDIOVASCULAR: No chest pain, orthopnea, edema.  GASTROINTESTINAL: Positive for nausea, vomiting, and right flank abdominal pain. No blood in bowel movements GENITOURINARY: No dysuria.  Positive for dark urine.  Positive for right flank pain ENDOCRINE: No polyuria, nocturia,  HEMATOLOGY: No anemia, easy bruising or bleeding SKIN: No rash or lesion. MUSCULOSKELETAL: No joint pain or arthritis.   NEUROLOGIC: No tingling, numbness, weakness.  PSYCHIATRY: Some anxiety  MEDICATIONS AT HOME:   Prior to Admission  medications   Medication Sig Start Date End Date Taking? Authorizing Provider  aspirin EC 81 MG tablet Take 81 mg by mouth daily.    Yes [provider]  atorvastatin (LIPITOR) 80 MG tablet Take 80 mg by mouth daily at 6 PM.  02/03/15  Yes [provider]  EFFIENT 10 MG TABS tablet Take 10 mg by mouth daily. Reported on 09/09/2015 01/08/15  Yes [provider]  metoprolol tartrate (LOPRESSOR) 25 MG tablet Take 1 tablet (25 mg total) by mouth 2 (two) times daily. 05/04/16 02/20/18 Yes Governor Rooks, MD  Multiple Vitamin (MULTIVITAMIN) tablet Take 1 tablet by mouth daily.   Yes [provider]  omeprazole (PRILOSEC OTC) 20 MG tablet Take 20 mg by mouth daily.   Yes [provider]  RANEXA 1000 MG SR tablet Take 1,000 mg by mouth 2 (two) times daily.  01/07/15  Yes [provider]  ranitidine (ZANTAC) 75 MG tablet Take 75 mg by mouth 2 (two) times daily.   Yes [provider]  spironolactone (ALDACTONE) 25 MG tablet Take 25 mg by mouth daily.  01/07/15  Yes [provider]  vitamin B-12 (CYANOCOBALAMIN) 1000 MCG tablet Take 1 tablet by mouth daily.   Yes [provider]      VITAL SIGNS:  Blood pressure (!) 141/89, pulse 68, temperature 97.6 F (36.4 C), temperature source Oral, resp. rate 18, height 5\' 7"  (1.702 m), weight 83.9 kg, SpO2 100 %.  PHYSICAL EXAMINATION:  GENERAL:  62 y.o.-year-old patient lying in the bed with no acute distress.  EYES: Pupils equal, round, reactive to light and accommodation. No scleral icterus. Extraocular muscles intact.  HEENT: Head atraumatic, normocephalic. Oropharynx and nasopharynx clear.  NECK:  Supple, no jugular venous distention. No thyroid enlargement, no tenderness.  LUNGS: Normal breath sounds bilaterally, no wheezing, rales,rhonchi or crepitation. No use of accessory muscles of respiration.  CARDIOVASCULAR: S1, S2 normal. No murmurs, rubs, or gallops.  ABDOMEN: Soft, right flank  tenderness, no CVA tenderness, nondistended. Bowel sounds present. No organomegaly or mass.  EXTREMITIES: No pedal edema, cyanosis, or clubbing.  NEUROLOGIC: Cranial nerves II through XII are intact. Muscle strength 5/5 in all extremities. Sensation intact. Gait not checked.  PSYCHIATRIC: The patient is alert and oriented x 3.  SKIN: No rash, lesion, or ulcer.   LABORATORY PANEL:   CBC Recent Labs  Lab 02/20/18 0521  WBC 7.2  HGB 14.4  HCT 41.6  PLT 209   ------------------------------------------------------------------------------------------------------------------  Chemistries  Recent Labs  Lab 02/20/18 0521  NA 141  K 3.9  CL 105  CO2 28  GLUCOSE 95  BUN 14  CREATININE 1.89*  CALCIUM 9.3   ------------------------------------------------------------------------------------------------------------------   RADIOLOGY:  Dg Abdomen 1 View  Result Date: 02/20/2018 CLINICAL DATA:  Initial evaluation for  acute right flank pain. History of kidney stones. EXAM: ABDOMEN - 1 VIEW COMPARISON:  Prior CT from 12/17/2017. FINDINGS: Bowel gas pattern within normal limits without evidence for obstruction. Moderate stool within the ascending colon. 9 mm calcific density overlies the expected location of the mid-distal right ureter, which could reflect ureterolithiasis. Adjacent smaller 3 mm calcification noted as well. Few additional punctate calcific densities overlying the right renal shadow likely reflect additional renal calculi. No definite left-sided stones. No acute osseous abnormality. IMPRESSION: 1. Few calculi measuring up to 9 mm overlying the expected location of the mid-distal right ureter, suspicious for right ureterolithiasis given history of right flank pain. Finding could be further assessed with cross-sectional imaging as clinically desired. 2. Probable additional subcentimeter right renal nephrolithiasis. 3. Nonobstructive bowel gas pattern. Electronically Signed   By:  Rise Mu M.D.   On: 02/20/2018 06:02   Ct Renal Stone Study  Result Date: 02/20/2018 CLINICAL DATA:  62 year old male with history of right-sided flank pain for the past 5 days accompanied by nausea. History of kidney stones status post lithotripsy. EXAM: CT ABDOMEN AND PELVIS WITHOUT CONTRAST TECHNIQUE: Multidetector CT imaging of the abdomen and pelvis was performed following the standard protocol without IV contrast. COMPARISON:  CT the abdomen and pelvis 12/17/2017. FINDINGS: Lower chest: Coronary artery stent in the right coronary artery. Pacemaker lead terminating in the right ventricle near the apex. Hepatobiliary: Small low-attenuation lesions in the liver are incompletely characterized on today's noncontrast CT examination, but similar in size and appearance to prior studies and favored to represent tiny cysts. No larger more suspicious appearing hepatic lesions are noted. Unenhanced appearance of the gallbladder is normal. Pancreas: No definite pancreatic mass or peripancreatic fluid or inflammatory changes are noted on today's noncontrast CT examination. Spleen: Unremarkable. Adrenals/Urinary Tract: Multiple nonobstructive calculi are noted within the collecting systems of both kidneys measuring up to 4 mm in the lower pole collecting system of the right kidney. In addition, in the right ureter there are 2 calculi in the mid ureter, largest of which measures 10 x 5 x 5 mm. This is associated with moderate proximal right hydroureteronephrosis. No left-sided ureteral stones are identified. No stones within the urinary bladder. Bilateral adrenal glands are normal in appearance. Stomach/Bowel: Unenhanced appearance of the stomach is normal. Small duodenal diverticulum extending off the posterior aspect of the second portion of the duodenum incidentally noted. No pathologic dilatation of small bowel or colon. Normal appendix. Vascular/Lymphatic: Aortic atherosclerosis. Retroaortic left renal  vein (normal anatomical variant) incidentally noted. No lymphadenopathy noted in the abdomen or pelvis. Reproductive: Prostate gland and seminal vesicles are unremarkable in appearance. Other: No significant volume of ascites.  No pneumoperitoneum. Musculoskeletal: There are no aggressive appearing lytic or blastic lesions noted in the visualized portions of the skeleton. IMPRESSION: 1. There are 2 calculi in the middle third of the right ureter, largest of which measures 10 x 5 x 5 mm. This is associated with moderate proximal hydroureteronephrosis. Several additional nonobstructive calculi are noted within the collecting systems of both kidneys measuring up to 4 mm in the lower pole collecting system of the right kidney. 2. Aortic atherosclerosis. 3. Additional incidental findings, as above. Electronically Signed   By: Trudie Reed M.D.   On: 02/20/2018 07:17    EKG:   Ordered by me  IMPRESSION AND PLAN:   1.  Acute kidney injury on chronic kidney disease stage III, right nephrolithiasis and right hydronephrosis.  Case discussed with Dr. Apolinar Junes urology and  patient has opted for lithotripsy tomorrow.  Will allow to eat today and n.p.o. after midnight.  Hold blood thinners aspirin and Effient.  As per Dr. Apolinar JunesBrandon can hold off on antibiotics at this point.  Start Flomax and very gentle IV fluid hydration.  PRN pain control with Dilaudid and nausea control with Phenergan. 2.  History of coronary artery disease and systolic congestive heart failure.  Currently no signs of heart failure at this time. 3.  Hyperlipidemia unspecified on Lipitor 4.  Essential hypertension on metoprolol.  Hold Spironolactone for now with acute kidney injury 5.  GERD on PPI and H2 blocker  All the records are reviewed and case discussed with ED provider. Management plans discussed with the patient, family and they are in agreement.  CODE STATUS: Full code  TOTAL TIME TAKING CARE OF THIS PATIENT: 50 minutes, including  acp time.    Alford Highlandichard Janeece Blok M.D on 02/20/2018 at 10:14 AM  Between 7am to 6pm - Pager - [phone]  After 6pm call admission pager [phone]  [facility]  [phone]  CC: Primary care physician; Marisue IvanLinthavong, [place], MD

## 2018-02-20 NOTE — ED Notes (Signed)
Report given to oncoming RN.

## 2018-02-20 NOTE — ED Notes (Signed)
After dilauded was administered slow iv push, pt began feeling nauseated, and "a little dizzy," iv fluids initiated, cold cloth applied to head, pt states he is already feeling a little better. Wife at bedside.

## 2018-02-20 NOTE — ED Notes (Signed)
Sats 99% on 2L. Pt in no distress.

## 2018-02-20 NOTE — ED Provider Notes (Signed)
On reevaluation, patient continues to have significant pain and nausea and vomiting.  CT scan demonstrates 2 large kidney stones in the right mid ureter.  Additional dose of Dilaudid ordered, Phenergan ordered.     Discussed with Dr. Apolinar JunesBrandon of urology who will see the patient and asked that we keep him n.p.o.  Discussed with Dr. Katheren ShamsSalary for admission   Jene EveryKinner, Dante Cooter, MD 02/20/18 [phone]

## 2018-02-20 NOTE — Progress Notes (Signed)
Patient ID: Elijah [name], male   DOB: 03-Jul-1955, 62 y.o.   MRN: [id number]  EKG reviewed.  No contraindications to procedure tomorrow at this time. Dr. Alford Highlandichard Lakin Rhine

## 2018-02-20 NOTE — Progress Notes (Addendum)
Patient ID: Elijah [name], male   DOB: [birth year], 62 y.o.   MRN: [id number]  ACP note  Patient and wife at the bedside  Diagnosis: Acute kidney injury on chronic kidney disease stage III, nephrolithiasis with hydronephrosis on the right, history of CAD and systolic congestive heart failure, essential hypertension, hyperlipidemia, GERD.  CODE STATUS discussed and patient wishes to be full code  Plan.  Lithotripsy tomorrow.  N.p.o. after midnight.  Hold blood thinners.  Gentle IV fluid hydration and Flomax.  Check kidney function tomorrow morning.  Time spent on ACP discussion 17 minutes Dr. Alford Highlandichard Emauri Krygier

## 2018-02-20 NOTE — H&P (View-Only) (Signed)
Urology Consult  I have been asked to see the patient by Dr. Sharee HolsterKenner/ [place], for evaluation and management of right ureteral stone.  Chief Complaint: Right flank pain  History of Present Illness: Elijah [name] is a 62 y.o. year old with personal history of nephrolithiasis presenting with 2 partially obstructing right mid ureteral calculi measuring up to 1 cm in length.  Mr. Elijah [name] has been experiencing intermittent right flank pain over the past week.  This morning, the intensity was more severe with associated nausea and vomiting that she presented to the emergency room.  He also had a low-grade temp to 99 5 without true fevers or chills.  No exacerbating or alleviating factors.  No urinary symptoms including no dysuria.  He did have an episode of "dark urine" which he believes was blood.  In the ER, CT scan revealed right mid ureteral stone, 2 calculi just above the level of the iliacs with hydronephrosis.  He also was noted to have a slight rise in his creatinine but otherwise no leukocytosis, normotensive, and no evidence of UTI on UA.  He was admitted to the medical service for pain control and further management of his obstructing stone.  Notably, the patient was seen in July with a right ureteral calculus but passed the stone spontaneously (visually confirmed).  He is also undergone multiple stone procedures in the past including most recently ureteroscopy and 2 successful shockwave's prior to that.  Past Medical History:  Diagnosis Date  . Acid reflux   . Anemia   . Anginal pain (HCC)   . Anxiety   . CHF (congestive heart failure) (HCC)   . Coronary artery disease   . Heart attack [facility](HCC) December 2012  . History of panic attacks   . Hyperlipidemia   . Kidney stones   . Presence of permanent cardiac pacemaker   . Restless leg syndrome   . Shortness of breath dyspnea     Past Surgical History:  Procedure Laterality Date  . CARDIAC CATHETERIZATION    . CORONARY  ANGIOPLASTY    . CORONARY STENT PLACEMENT    . CYSTOSCOPY W/ RETROGRADES Left 03/16/2015   Procedure: CYSTOSCOPY WITH RETROGRADE PYELOGRAM;  Surgeon: Crist FatBenjamin W Herrick, MD;  Location: [facility] ORS;  Service: Urology;  Laterality: Left;  . CYSTOSCOPY WITH STENT PLACEMENT Left 03/16/2015   Procedure: CYSTOSCOPY WITH STENT PLACEMENT;  Surgeon: Crist FatBenjamin W Herrick, MD;  Location: [facility] ORS;  Service: Urology;  Laterality: Left;  . CYSTOSCOPY/URETEROSCOPY/HOLMIUM LASER/STENT PLACEMENT Left 03/16/2015   Procedure: CYSTOSCOPY/URETEROSCOPY/HOLMIUM LASER/STENT PLACEMENT;  Surgeon: Crist FatBenjamin W Herrick, MD;  Location: [facility] ORS;  Service: Urology;  Laterality: Left;  . EXTRACORPOREAL SHOCK WAVE LITHOTRIPSY    . INSERT / REPLACE / REMOVE PACEMAKER    . PACEMAKER INSERTION      Home Medications:  Current Meds  Medication Sig  . aspirin EC 81 MG tablet Take 81 mg by mouth daily.   Marland Kitchen. atorvastatin (LIPITOR) 80 MG tablet Take 80 mg by mouth daily at 6 PM.   . EFFIENT 10 MG TABS tablet Take 10 mg by mouth daily. Reported on 09/09/2015  . metoprolol tartrate (LOPRESSOR) 25 MG tablet Take 1 tablet (25 mg total) by mouth 2 (two) times daily.  . Multiple Vitamin (MULTIVITAMIN) tablet Take 1 tablet by mouth daily.  Marland Kitchen. omeprazole (PRILOSEC OTC) 20 MG tablet Take 20 mg by mouth daily.  Marland Kitchen. RANEXA 1000 MG SR tablet Take 1,000 mg by mouth 2 (two) times daily.   .Marland Kitchen  ranitidine (ZANTAC) 75 MG tablet Take 75 mg by mouth 2 (two) times daily.  Marland Kitchen spironolactone (ALDACTONE) 25 MG tablet Take 25 mg by mouth daily.   . vitamin B-12 (CYANOCOBALAMIN) 1000 MCG tablet Take 1 tablet by mouth daily.    Allergies:  Allergies  Allergen Reactions  . Hydrocodone-Acetaminophen Nausea And Vomiting    Causes vomiting and GI upset Causes vomiting and GI upset  . Morphine Nausea And Vomiting    Causes vomiting and GI upset  . Sulfa Antibiotics Rash    Family History  Problem Relation Age of Onset  . Emphysema Father   . Urolithiasis Neg  Hx   . Prostate cancer Neg Hx   . Kidney disease Neg Hx   . Kidney cancer Neg Hx     Social History:  reports that he quit smoking about 6 years ago. His smoking use included cigarettes. He smoked 2.00 packs per day. He has never used smokeless tobacco. He reports that he does not drink alcohol or use drugs.  ROS: A complete review of systems was performed.  All systems are negative except for pertinent findings as noted.  Physical Exam:  Vital signs in last 24 hours: Temp:  [97.6 F (36.4 C)] 97.6 F (36.4 C) (09/18 0500) Pulse Rate:  [62-75] 66 (09/18 0833) Resp:  [18] 18 (09/18 0833) BP: (112-153)/(80-88) 112/87 (09/18 0833) SpO2:  [97 %-100 %] 99 % (09/18 0833) Weight:  [83.9 kg] 83.9 kg (09/18 0501) Constitutional:  Alert and oriented, No acute distress HEENT: [place] AT, moist mucus membranes.  Trachea midline, no masses Cardiovascular: Regular rate and rhythm, no clubbing, cyanosis, or edema. Respiratory: Normal respiratory effort, lungs clear bilaterally GI: Abdomen is soft, nontender, nondistended, no abdominal masses GU: Mild right CVA tenderness extending into right lower quadrant Skin: No rashes, bruises or suspicious lesions Neurologic: Grossly intact, no focal deficits, moving all 4 extremities Psychiatric: Normal mood and affect   Laboratory Data:  Recent Labs    02/20/18 0521  WBC 7.2  HGB 14.4  HCT 41.6   Recent Labs    02/20/18 0521  NA 141  K 3.9  CL 105  CO2 28  GLUCOSE 95  BUN 14  CREATININE 1.89*  CALCIUM 9.3   Component     Latest Ref Rng & Units 02/20/2018  Color, Urine     YELLOW YELLOW (A)  Appearance     CLEAR CLEAR (A)  Glucose, UA     NEGATIVE mg/dL NEGATIVE  Bilirubin Urine     NEGATIVE NEGATIVE  Ketones, ur     NEGATIVE mg/dL NEGATIVE  Specific Gravity, Urine     1.005 - 1.030 1.030  Hgb urine dipstick     NEGATIVE MODERATE (A)  pH     5.0 - 8.0 5.0  Protein     NEGATIVE mg/dL NEGATIVE  Nitrite     NEGATIVE NEGATIVE    Leukocytes, UA     NEGATIVE NEGATIVE  RBC / HPF     0 - 5 RBC/hpf 11-20  WBC, UA     0 - 5 WBC/hpf NONE SEEN  Bacteria, UA     NONE SEEN NONE SEEN  Squamous Epithelial / LPF     0 - 5 NONE SEEN  Mucus      PRESENT    Radiologic Imaging: Dg Abdomen 1 View  Result Date: 02/20/2018 CLINICAL DATA:  Initial evaluation for acute right flank pain. History of kidney stones. EXAM: ABDOMEN - 1 VIEW COMPARISON:  Prior  CT from 12/17/2017. FINDINGS: Bowel gas pattern within normal limits without evidence for obstruction. Moderate stool within the ascending colon. 9 mm calcific density overlies the expected location of the mid-distal right ureter, which could reflect ureterolithiasis. Adjacent smaller 3 mm calcification noted as well. Few additional punctate calcific densities overlying the right renal shadow likely reflect additional renal calculi. No definite left-sided stones. No acute osseous abnormality. IMPRESSION: 1. Few calculi measuring up to 9 mm overlying the expected location of the mid-distal right ureter, suspicious for right ureterolithiasis given history of right flank pain. Finding could be further assessed with cross-sectional imaging as clinically desired. 2. Probable additional subcentimeter right renal nephrolithiasis. 3. Nonobstructive bowel gas pattern. Electronically Signed   By: Rise Mu M.D.   On: 02/20/2018 06:02   Ct Renal Stone Study  Result Date: 02/20/2018 CLINICAL DATA:  62 year old male with history of right-sided flank pain for the past 5 days accompanied by nausea. History of kidney stones status post lithotripsy. EXAM: CT ABDOMEN AND PELVIS WITHOUT CONTRAST TECHNIQUE: Multidetector CT imaging of the abdomen and pelvis was performed following the standard protocol without IV contrast. COMPARISON:  CT the abdomen and pelvis 12/17/2017. FINDINGS: Lower chest: Coronary artery stent in the right coronary artery. Pacemaker lead terminating in the right ventricle  near the apex. Hepatobiliary: Small low-attenuation lesions in the liver are incompletely characterized on today's noncontrast CT examination, but similar in size and appearance to prior studies and favored to represent tiny cysts. No larger more suspicious appearing hepatic lesions are noted. Unenhanced appearance of the gallbladder is normal. Pancreas: No definite pancreatic mass or peripancreatic fluid or inflammatory changes are noted on today's noncontrast CT examination. Spleen: Unremarkable. Adrenals/Urinary Tract: Multiple nonobstructive calculi are noted within the collecting systems of both kidneys measuring up to 4 mm in the lower pole collecting system of the right kidney. In addition, in the right ureter there are 2 calculi in the mid ureter, largest of which measures 10 x 5 x 5 mm. This is associated with moderate proximal right hydroureteronephrosis. No left-sided ureteral stones are identified. No stones within the urinary bladder. Bilateral adrenal glands are normal in appearance. Stomach/Bowel: Unenhanced appearance of the stomach is normal. Small duodenal diverticulum extending off the posterior aspect of the second portion of the duodenum incidentally noted. No pathologic dilatation of small bowel or colon. Normal appendix. Vascular/Lymphatic: Aortic atherosclerosis. Retroaortic left renal vein (normal anatomical variant) incidentally noted. No lymphadenopathy noted in the abdomen or pelvis. Reproductive: Prostate gland and seminal vesicles are unremarkable in appearance. Other: No significant volume of ascites.  No pneumoperitoneum. Musculoskeletal: There are no aggressive appearing lytic or blastic lesions noted in the visualized portions of the skeleton. IMPRESSION: 1. There are 2 calculi in the middle third of the right ureter, largest of which measures 10 x 5 x 5 mm. This is associated with moderate proximal hydroureteronephrosis. Several additional nonobstructive calculi are noted within  the collecting systems of both kidneys measuring up to 4 mm in the lower pole collecting system of the right kidney. 2. Aortic atherosclerosis. 3. Additional incidental findings, as above. Electronically Signed   By: Trudie Reed M.D.   On: 02/20/2018 07:17   CT scan personally reviewed with the patient today as well as KUB in the ED.  Agree with above.    Impression/Assessment:  62 year old male with 2 obstructing right ureteral calculi without signs or symptoms of infection and being admitted for pain control management of his stones.  Lengthy discussion today with  the patient about his treatment options.  Unfortunately, he is on Effient but does not take this medication for 4 days.  Despite this, ESWL is contraindicated as this must be held for at least 7 days prior to intervention.  As such, his options include ureteroscopy or expectant management.  Based on the size and location of the stone, I would urge him surgical intervention given the 2 stones and relatively large in size.  Risks and benefits of ureteroscopy were reviewed including but not limited to infection, bleeding, pain, ureteral injury which could require open surgery versus prolonged indwelling if ureteral perforation occurs, persistent stone disease, requirement for staged procedure, possible stent, and global anesthesia risks. Patient expressed understanding and desires to proceed with ureteroscopy.  Plan:  -N.p.o. Midnight -Discussed consent, order placed -Patient's emergent right side -Plan for right ureteroscopy, laser lithotripsy, right ureteral stent placement tomorrow at 10:15 AM  Case was discussed with Dr. Hilton Sinclair as well as Dr. Henrene Hawking from anesthesia.  Given his cardiac history, an EKG was obtained but no further cardiac clearance is necessary.  02/20/2018, 9:54 AM  Vanna Scotland,  MD

## 2018-02-20 NOTE — ED Notes (Signed)
Pt having episodes of desatting in upper 80's when drifting off to sleep. [place] 2L applied, Dr Roxan Hockeyobinson made aware. Pt states dizziness and nausea mostly gone. Pain level decreased to 2.

## 2018-02-20 NOTE — Consult Note (Signed)
Urology Consult  I have been asked to see the patient by Dr. Sharee HolsterKenner/ [place], for evaluation and management of right ureteral stone.  Chief Complaint: Right flank pain  History of Present Illness: Elijah [name] is a 62 y.o. year old with personal history of nephrolithiasis presenting with 2 partially obstructing right mid ureteral calculi measuring up to 1 cm in length.  Mr. Elijah [name] has been experiencing intermittent right flank pain over the past week.  This morning, the intensity was more severe with associated nausea and vomiting that she presented to the emergency room.  He also had a low-grade temp to 99 5 without true fevers or chills.  No exacerbating or alleviating factors.  No urinary symptoms including no dysuria.  He did have an episode of "dark urine" which he believes was blood.  In the ER, CT scan revealed right mid ureteral stone, 2 calculi just above the level of the iliacs with hydronephrosis.  He also was noted to have a slight rise in his creatinine but otherwise no leukocytosis, normotensive, and no evidence of UTI on UA.  He was admitted to the medical service for pain control and further management of his obstructing stone.  Notably, the patient was seen in July with a right ureteral calculus but passed the stone spontaneously (visually confirmed).  He is also undergone multiple stone procedures in the past including most recently ureteroscopy and 2 successful shockwave's prior to that.  Past Medical History:  Diagnosis Date  . Acid reflux   . Anemia   . Anginal pain (HCC)   . Anxiety   . CHF (congestive heart failure) (HCC)   . Coronary artery disease   . Heart attack [facility](HCC) December 2012  . History of panic attacks   . Hyperlipidemia   . Kidney stones   . Presence of permanent cardiac pacemaker   . Restless leg syndrome   . Shortness of breath dyspnea     Past Surgical History:  Procedure Laterality Date  . CARDIAC CATHETERIZATION    . CORONARY  ANGIOPLASTY    . CORONARY STENT PLACEMENT    . CYSTOSCOPY W/ RETROGRADES Left 03/16/2015   Procedure: CYSTOSCOPY WITH RETROGRADE PYELOGRAM;  Surgeon: Crist FatBenjamin W Herrick, MD;  Location: [facility] OR[facility];  Service: Urology;  Laterality: Left;  . CYSTOSCOPY WITH STENT PLACEMENT Left 03/16/2015   Procedure: CYSTOSCOPY WITH STENT PLACEMENT;  Surgeon: Crist FatBenjamin W Herrick, MD;  Location: [facility] OR[facility];  Service: Urology;  Laterality: Left;  . CYSTOSCOPY/URETEROSCOPY/HOLMIUM LASER/STENT PLACEMENT Left 03/16/2015   Procedure: CYSTOSCOPY/URETEROSCOPY/HOLMIUM LASER/STENT PLACEMENT;  Surgeon: Crist FatBenjamin W Herrick, MD;  Location: [facility] ORS;  Service: Urology;  Laterality: Left;  . EXTRACORPOREAL SHOCK WAVE LITHOTRIPSY    . INSERT / REPLACE / REMOVE PACEMAKER    . PACEMAKER INSERTION      Home Medications:  Current Meds  Medication Sig  . aspirin EC 81 MG tablet Take 81 mg by mouth daily.   Marland Kitchen. atorvastatin (LIPITOR) 80 MG tablet Take 80 mg by mouth daily at 6 PM.   . EFFIENT 10 MG TABS tablet Take 10 mg by mouth daily. Reported on 09/09/2015  . metoprolol tartrate (LOPRESSOR) 25 MG tablet Take 1 tablet (25 mg total) by mouth 2 (two) times daily.  . Multiple Vitamin (MULTIVITAMIN) tablet Take 1 tablet by mouth daily.  Marland Kitchen. omeprazole (PRILOSEC OTC) 20 MG tablet Take 20 mg by mouth daily.  Marland Kitchen. RANEXA 1000 MG SR tablet Take 1,000 mg by mouth 2 (two) times daily.   .Marland Kitchen  ranitidine (ZANTAC) 75 MG tablet Take 75 mg by mouth 2 (two) times daily.  Marland Kitchen spironolactone (ALDACTONE) 25 MG tablet Take 25 mg by mouth daily.   . vitamin B-12 (CYANOCOBALAMIN) 1000 MCG tablet Take 1 tablet by mouth daily.    Allergies:  Allergies  Allergen Reactions  . Hydrocodone-Acetaminophen Nausea And Vomiting    Causes vomiting and GI upset Causes vomiting and GI upset  . Morphine Nausea And Vomiting    Causes vomiting and GI upset  . Sulfa Antibiotics Rash    Family History  Problem Relation Age of Onset  . Emphysema Father   . Urolithiasis Neg  Hx   . Prostate cancer Neg Hx   . Kidney disease Neg Hx   . Kidney cancer Neg Hx     Social History:  reports that he quit smoking about 6 years ago. His smoking use included cigarettes. He smoked 2.00 packs per day. He has never used smokeless tobacco. He reports that he does not drink alcohol or use drugs.  ROS: A complete review of systems was performed.  All systems are negative except for pertinent findings as noted.  Physical Exam:  Vital signs in last 24 hours: Temp:  [97.6 F (36.4 C)] 97.6 F (36.4 C) (09/18 0500) Pulse Rate:  [62-75] 66 (09/18 0833) Resp:  [18] 18 (09/18 0833) BP: (112-153)/(80-88) 112/87 (09/18 0833) SpO2:  [97 %-100 %] 99 % (09/18 0833) Weight:  [83.9 kg] 83.9 kg (09/18 0501) Constitutional:  Alert and oriented, No acute distress HEENT: [place] AT, moist mucus membranes.  Trachea midline, no masses Cardiovascular: Regular rate and rhythm, no clubbing, cyanosis, or edema. Respiratory: Normal respiratory effort, lungs clear bilaterally GI: Abdomen is soft, nontender, nondistended, no abdominal masses GU: Mild right CVA tenderness extending into right lower quadrant Skin: No rashes, bruises or suspicious lesions Neurologic: Grossly intact, no focal deficits, moving all 4 extremities Psychiatric: Normal mood and affect   Laboratory Data:  Recent Labs    02/20/18 0521  WBC 7.2  HGB 14.4  HCT 41.6   Recent Labs    02/20/18 0521  NA 141  K 3.9  CL 105  CO2 28  GLUCOSE 95  BUN 14  CREATININE 1.89*  CALCIUM 9.3   Component     Latest Ref Rng & Units 02/20/2018  Color, Urine     YELLOW YELLOW (A)  Appearance     CLEAR CLEAR (A)  Glucose, UA     NEGATIVE mg/dL NEGATIVE  Bilirubin Urine     NEGATIVE NEGATIVE  Ketones, ur     NEGATIVE mg/dL NEGATIVE  Specific Gravity, Urine     1.005 - 1.030 1.030  Hgb urine dipstick     NEGATIVE MODERATE (A)  pH     5.0 - 8.0 5.0  Protein     NEGATIVE mg/dL NEGATIVE  Nitrite     NEGATIVE NEGATIVE    Leukocytes, UA     NEGATIVE NEGATIVE  RBC / HPF     0 - 5 RBC/hpf 11-20  WBC, UA     0 - 5 WBC/hpf NONE SEEN  Bacteria, UA     NONE SEEN NONE SEEN  Squamous Epithelial / LPF     0 - 5 NONE SEEN  Mucus      PRESENT    Radiologic Imaging: Dg Abdomen 1 View  Result Date: 02/20/2018 CLINICAL DATA:  Initial evaluation for acute right flank pain. History of kidney stones. EXAM: ABDOMEN - 1 VIEW COMPARISON:  Prior  CT from 12/17/2017. FINDINGS: Bowel gas pattern within normal limits without evidence for obstruction. Moderate stool within the ascending colon. 9 mm calcific density overlies the expected location of the mid-distal right ureter, which could reflect ureterolithiasis. Adjacent smaller 3 mm calcification noted as well. Few additional punctate calcific densities overlying the right renal shadow likely reflect additional renal calculi. No definite left-sided stones. No acute osseous abnormality. IMPRESSION: 1. Few calculi measuring up to 9 mm overlying the expected location of the mid-distal right ureter, suspicious for right ureterolithiasis given history of right flank pain. Finding could be further assessed with cross-sectional imaging as clinically desired. 2. Probable additional subcentimeter right renal nephrolithiasis. 3. Nonobstructive bowel gas pattern. Electronically Signed   By: Rise Mu M.D.   On: 02/20/2018 06:02   Ct Renal Stone Study  Result Date: 02/20/2018 CLINICAL DATA:  62 year old male with history of right-sided flank pain for the past 5 days accompanied by nausea. History of kidney stones status post lithotripsy. EXAM: CT ABDOMEN AND PELVIS WITHOUT CONTRAST TECHNIQUE: Multidetector CT imaging of the abdomen and pelvis was performed following the standard protocol without IV contrast. COMPARISON:  CT the abdomen and pelvis 12/17/2017. FINDINGS: Lower chest: Coronary artery stent in the right coronary artery. Pacemaker lead terminating in the right ventricle  near the apex. Hepatobiliary: Small low-attenuation lesions in the liver are incompletely characterized on today's noncontrast CT examination, but similar in size and appearance to prior studies and favored to represent tiny cysts. No larger more suspicious appearing hepatic lesions are noted. Unenhanced appearance of the gallbladder is normal. Pancreas: No definite pancreatic mass or peripancreatic fluid or inflammatory changes are noted on today's noncontrast CT examination. Spleen: Unremarkable. Adrenals/Urinary Tract: Multiple nonobstructive calculi are noted within the collecting systems of both kidneys measuring up to 4 mm in the lower pole collecting system of the right kidney. In addition, in the right ureter there are 2 calculi in the mid ureter, largest of which measures 10 x 5 x 5 mm. This is associated with moderate proximal right hydroureteronephrosis. No left-sided ureteral stones are identified. No stones within the urinary bladder. Bilateral adrenal glands are normal in appearance. Stomach/Bowel: Unenhanced appearance of the stomach is normal. Small duodenal diverticulum extending off the posterior aspect of the second portion of the duodenum incidentally noted. No pathologic dilatation of small bowel or colon. Normal appendix. Vascular/Lymphatic: Aortic atherosclerosis. Retroaortic left renal vein (normal anatomical variant) incidentally noted. No lymphadenopathy noted in the abdomen or pelvis. Reproductive: Prostate gland and seminal vesicles are unremarkable in appearance. Other: No significant volume of ascites.  No pneumoperitoneum. Musculoskeletal: There are no aggressive appearing lytic or blastic lesions noted in the visualized portions of the skeleton. IMPRESSION: 1. There are 2 calculi in the middle third of the right ureter, largest of which measures 10 x 5 x 5 mm. This is associated with moderate proximal hydroureteronephrosis. Several additional nonobstructive calculi are noted within  the collecting systems of both kidneys measuring up to 4 mm in the lower pole collecting system of the right kidney. 2. Aortic atherosclerosis. 3. Additional incidental findings, as above. Electronically Signed   By: Trudie Reed M.D.   On: 02/20/2018 07:17   CT scan personally reviewed with the patient today as well as KUB in the ED.  Agree with above.    Impression/Assessment:  62 year old male with 2 obstructing right ureteral calculi without signs or symptoms of infection and being admitted for pain control management of his stones.  Lengthy discussion today with  the patient about his treatment options.  Unfortunately, he is on Effient but does not take this medication for 4 days.  Despite this, ESWL is contraindicated as this must be held for at least 7 days prior to intervention.  As such, his options include ureteroscopy or expectant management.  Based on the size and location of the stone, I would urge him surgical intervention given the 2 stones and relatively large in size.  Risks and benefits of ureteroscopy were reviewed including but not limited to infection, bleeding, pain, ureteral injury which could require open surgery versus prolonged indwelling if ureteral perforation occurs, persistent stone disease, requirement for staged procedure, possible stent, and global anesthesia risks. Patient expressed understanding and desires to proceed with ureteroscopy.  Plan:  -N.p.o. Midnight -Discussed consent, order placed -Patient's emergent right side -Plan for right ureteroscopy, laser lithotripsy, right ureteral stent placement tomorrow at 10:15 AM  Case was discussed with Dr. Hilton Sinclair as well as Dr. Henrene Hawking from anesthesia.  Given his cardiac history, an EKG was obtained but no further cardiac clearance is necessary.  02/20/2018, 9:54 AM  Vanna Scotland,  MD

## 2018-02-20 NOTE — Anesthesia Preprocedure Evaluation (Addendum)
Anesthesia Evaluation  Patient identified by MRN, date of birth, ID band Patient awake    Reviewed: Allergy & Precautions, NPO status , Patient's Chart, lab work & pertinent test results  History of Anesthesia Complications Negative for: history of anesthetic complications  Airway Mallampati: III       Dental   Pulmonary neg shortness of breath, neg sleep apnea, former smoker,           Cardiovascular hypertension, + CAD, + Past MI, + Cardiac Stents and +CHF (LVEF 30%)  + pacemaker + Cardiac Defibrillator (-) Valvular Problems/Murmurs     Neuro/Psych neg Seizures PSYCHIATRIC DISORDERS Anxiety    GI/Hepatic Neg liver ROS, GERD  Medicated,  Endo/Other  neg diabetes  Renal/GU Renal disease (stones)     Musculoskeletal   Abdominal   Peds  Hematology   Anesthesia Other Findings   Reproductive/Obstetrics                            Anesthesia Physical Anesthesia Plan  ASA: III  Anesthesia Plan: General   Post-op Pain Management:    Induction: Intravenous  PONV Risk Score and Plan: 2 and Ondansetron and Midazolam  Airway Management Planned: Oral ETT and LMA  Additional Equipment:   Intra-op Plan:   Post-operative Plan:   Informed Consent: I have reviewed the patients History and Physical, chart, labs and discussed the procedure including the risks, benefits and alternatives for the proposed anesthesia with the patient or authorized representative who has indicated his/her understanding and acceptance.     Plan Discussed with:   Anesthesia Plan Comments: (Patient has medical clearance for this procedure )       Anesthesia Quick Evaluation

## 2018-02-20 NOTE — ED Notes (Signed)
Attempted report at 10:23am. RN unavailable to take report at this time

## 2018-02-20 NOTE — ED Provider Notes (Signed)
[facility] Emergency Department Provider Note   ____________________________________________   First MD Initiated Contact with Patient 02/20/18 [phone]     (approximate)  I have reviewed the triage vital signs and the nursing notes.   HISTORY  Chief Complaint Flank Pain    HPI Elijah [name] is a 62 y.o. male who presents to the ED from home with a chief complaint of nontraumatic right flank pain.  Patient reports a 4-day history of waxing/waning right flank pain which is now radiating to his right lower quadrant.  Symptoms associated with nausea.  History of kidney stones.  He was seen in the ED in July and subsequently passed a 5 mm stone.  Has required lithotripsy in the past.  Denies associated fever, chills, chest pain, shortness of breath, vomiting, hematuria, diarrhea.  Denies recent travel or trauma.  Denies anticoagulant use.   Past Medical History:  Diagnosis Date  . Acid reflux   . Anemia   . Anginal pain (HCC)   . Anxiety   . CHF (congestive heart failure) (HCC)   . Coronary artery disease   . Heart attack [facility]) December 2012  . History of panic attacks   . Hyperlipidemia   . Kidney stones   . Presence of permanent cardiac pacemaker   . Restless leg syndrome   . Shortness of breath dyspnea     Patient Active Problem List   Diagnosis Date Noted  . BPH (benign prostatic hyperplasia) 09/10/2015  . Kidney stones 09/10/2015  . Gross hematuria 09/10/2015  . Flank pain 09/10/2015  . 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 Nephrolithiasis 03/02/2015  . 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 Episode of syncope 11/25/2013  . Dizziness 11/21/2013  . CCF (congestive cardiac failure) (HCC) 11/21/2013  . Congestive heart failure (HCC) 11/21/2013  . Acid reflux 03/22/2012  . Essential (primary) hypertension 03/22/2012  . Arteriosclerosis of coronary artery 03/22/2012    Past Surgical History:    Procedure Laterality Date  . CARDIAC CATHETERIZATION    . CORONARY ANGIOPLASTY    . CORONARY STENT PLACEMENT    . CYSTOSCOPY W/ RETROGRADES Left 03/16/2015   Procedure: CYSTOSCOPY WITH RETROGRADE PYELOGRAM;  Surgeon: Crist Fat, MD;  Location: [facility] ORS;  Service: Urology;  Laterality: Left;  . CYSTOSCOPY WITH STENT PLACEMENT Left 03/16/2015   Procedure: CYSTOSCOPY WITH STENT PLACEMENT;  Surgeon: Crist Fat, MD;  Location: [facility] ORS;  Service: Urology;  Laterality: Left;  . CYSTOSCOPY/URETEROSCOPY/HOLMIUM LASER/STENT PLACEMENT Left 03/16/2015   Procedure: CYSTOSCOPY/URETEROSCOPY/HOLMIUM LASER/STENT PLACEMENT;  Surgeon: Crist Fat, MD;  Location: [facility] ORS;  Service: Urology;  Laterality: Left;  . EXTRACORPOREAL SHOCK WAVE LITHOTRIPSY    . INSERT / REPLACE / REMOVE PACEMAKER    . PACEMAKER INSERTION      Prior to Admission medications   Medication Sig Start Date End Date Taking? Authorizing Provider  aspirin EC 81 MG tablet Take 81 mg by mouth daily.     [provider]  atorvastatin (LIPITOR) 40 MG tablet Take 40 mg by mouth daily at 6 PM.  02/03/15   [provider]  EFFIENT 10 MG TABS tablet Take 10 mg by mouth daily. Reported on 09/09/2015 01/08/15   [provider]  metoprolol tartrate (LOPRESSOR) 25 MG tablet Take 1 tablet (25 mg total) by mouth 2 (two) times daily. 05/04/16 05/04/17  Governor Rooks, MD  Multiple Vitamin (MULTIVITAMIN) tablet Take 1 tablet by mouth daily.    [provider]  naproxen (NAPROSYN) 500 MG tablet Take 1 tablet (500 mg total) by mouth 2 (two) times daily with a meal. 12/17/17   Sharman Cheek, MD  omeprazole (PRILOSEC OTC) 20 MG tablet Take 20 mg by mouth daily.    [provider]  oxyCODONE-acetaminophen (PERCOCET) 5-325 MG tablet Take 1 tablet by mouth every 6 (six) hours as needed for severe pain. 12/17/17 12/17/18  Sharman Cheek, MD  RANEXA 1000 MG SR tablet Take 1,000 mg by mouth 2 (two)  times daily.  01/07/15   [provider]  ranitidine (ZANTAC) 75 MG tablet Take 75 mg by mouth 2 (two) times daily.    [provider]  spironolactone (ALDACTONE) 25 MG tablet Take 25 mg by mouth daily.  01/07/15   [provider]  tamsulosin (FLOMAX) 0.4 MG CAPS capsule Take 1 capsule (0.4 mg total) by mouth daily. 12/17/17   Sharman Cheek, MD  vitamin B-12 (CYANOCOBALAMIN) 1000 MCG tablet Take 1 tablet by mouth daily.    [provider]    Allergies Hydrocodone-acetaminophen; Morphine; and Sulfa antibiotics  Family History  Problem Relation Age of Onset  . Emphysema Father   . Urolithiasis Neg Hx   . Prostate cancer Neg Hx   . Kidney disease Neg Hx   . Kidney cancer Neg Hx     Social History Social History   Tobacco Use  . Smoking status: Former Smoker    Packs/day: 2.00    Types: Cigarettes    Last attempt to quit: 04/06/2011    Years since quitting: 6.8  . Smokeless tobacco: Never Used  Substance Use Topics  . Alcohol use: No    Alcohol/week: 0.0 standard drinks  . Drug use: No    Review of Systems  Constitutional: No fever/chills Eyes: No visual changes. ENT: No sore throat. Cardiovascular: Denies chest pain. Respiratory: Denies shortness of breath. Gastrointestinal: Positive for right flank and abdominal pain.  Positive for nausea, no vomiting.  No diarrhea.  No constipation. Genitourinary: Negative for dysuria. Musculoskeletal: Negative for back pain. Skin: Negative for rash. Neurological: Negative for headaches, focal weakness or numbness.   ____________________________________________   PHYSICAL EXAM:  VITAL SIGNS: ED Triage Vitals  Enc Vitals Group     BP 02/20/18 0500 (!) 153/88     Pulse Rate 02/20/18 0500 75     Resp 02/20/18 0500 18     Temp 02/20/18 0500 97.6 F (36.4 C)     Temp Source 02/20/18 0500 Oral     SpO2 02/20/18 0500 98 %     Weight 02/20/18 0501 185 lb (83.9 kg)     Height 02/20/18 0501 5\' 7"   (1.702 m)     Head Circumference --      Peak Flow --      Pain Score 02/20/18 0501 6     Pain Loc --      Pain Edu? --      Excl. in GC? --     Constitutional: Alert and oriented. Well appearing and in no acute distress. Eyes: Conjunctivae are normal. PERRL. EOMI. Head: Atraumatic. Nose: No congestion/rhinnorhea. Mouth/Throat: Mucous membranes are moist.  Oropharynx non-erythematous. Neck: No stridor.   Cardiovascular: Normal rate, regular rhythm. Grossly normal heart sounds.  Good peripheral circulation. Respiratory: Normal respiratory effort.  No retractions. Lungs CTAB. Gastrointestinal: Soft and nontender to light or deep palpation. No distention. No abdominal bruits.  Mild right CVA tenderness. Musculoskeletal: No lower extremity tenderness nor edema.  No joint effusions. Neurologic:  Normal speech and language. No gross focal neurologic deficits are appreciated. No gait instability. Skin:  Skin is warm, dry and intact. No rash noted.  No vesicles. Psychiatric: Mood and affect are normal. Speech and behavior are normal.  ____________________________________________   LABS (all labs ordered are listed, but only abnormal results are displayed)  Labs Reviewed  BASIC METABOLIC PANEL - Abnormal; Notable for the following components:      Result Value   Creatinine, Ser 1.89 (*)    GFR calc non Af Amer 37 (*)    GFR calc Af Amer 43 (*)    All other components within normal limits  URINALYSIS, COMPLETE (UACMP) WITH MICROSCOPIC - Abnormal; Notable for the following components:   Color, Urine YELLOW (*)    APPearance CLEAR (*)    Hgb urine dipstick MODERATE (*)    All other components within normal limits  CBC WITH DIFFERENTIAL/PLATELET   ____________________________________________  EKG  None ____________________________________________  RADIOLOGY  ED MD interpretation: KUB demonstrates few calculi, measuring up to 9 mm  Official radiology report(s): Dg Abdomen 1  View  Result Date: 02/20/2018 CLINICAL DATA:  Initial evaluation for acute right flank pain. History of kidney stones. EXAM: ABDOMEN - 1 VIEW COMPARISON:  Prior CT from 12/17/2017. FINDINGS: Bowel gas pattern within normal limits without evidence for obstruction. Moderate stool within the ascending colon. 9 mm calcific density overlies the expected location of the mid-distal right ureter, which could reflect ureterolithiasis. Adjacent smaller 3 mm calcification noted as well. Few additional punctate calcific densities overlying the right renal shadow likely reflect additional renal calculi. No definite left-sided stones. No acute osseous abnormality. IMPRESSION: 1. Few calculi measuring up to 9 mm overlying the expected location of the mid-distal right ureter, suspicious for right ureterolithiasis given history of right flank pain. Finding could be further assessed with cross-sectional imaging as clinically desired. 2. Probable additional subcentimeter right renal nephrolithiasis. 3. Nonobstructive bowel gas pattern. Electronically Signed   By: Rise MuBenjamin  McClintock M.D.   On: 02/20/2018 06:02    ____________________________________________   PROCEDURES  Procedure(s) performed: None  Procedures  Critical Care performed: No  ____________________________________________   INITIAL IMPRESSION / ASSESSMENT AND PLAN / ED COURSE  As part of my medical decision making, I reviewed the following data within the electronic MEDICAL RECORD NUMBER History obtained from family, Nursing notes reviewed and incorporated, Labs reviewed, Old chart reviewed, Radiograph reviewed  and Notes from prior ED visits   62 year old male who presents with right flank pain; history of ureteral colic. Differential diagnosis includes, but is not limited to, acute appendicitis, renal colic, testicular torsion, urinary tract infection/pyelonephritis, prostatitis,  epididymitis, diverticulitis, small bowel obstruction or ileus,  colitis, abdominal aortic aneurysm, gastroenteritis, hernia, etc.  Will obtain screening lab work, urinalysis.  Initiate IV fluid resuscitation.  Administer  1mg  IV Dilaudid for pain paired with 4mg  IV Zofran for nausea.  I personally reviewed patient's old records and see the CT scan from July 2019.  Since patient had recent CT scan, will obtain KUB tonight.  Will proceed with CT scan as needed.  Will reassess.  Clinical Course as of Feb 20 653  Wed Feb 20, 2018  [id number] Multiple stone seen on KUB, including one that may measure 9 mm in size.  Patient currently resting in no acute distress; pain significantly relieved after 1 mg IV Dilaudid.  Given abnormal KUB, will proceed with CT renal colic study.   [JS]  [id number] Care transferred to Dr. Cyril LoosenKinner pending CT  results and disposition.   [JS]    Clinical Course User Index [JS] Irean Hong, MD     ____________________________________________   FINAL CLINICAL IMPRESSION(S) / ED DIAGNOSES  Final diagnoses:  Right flank pain  Kidney stone     ED Discharge Orders    None       Note:  This document was prepared using Dragon voice recognition software and may include unintentional dictation errors.    Irean Hong, MD 02/20/18 [phone]

## 2018-02-21 ENCOUNTER — Encounter: Payer: Self-pay | Admitting: *Deleted

## 2018-02-21 ENCOUNTER — Inpatient Hospital Stay: Payer: 59

## 2018-02-21 ENCOUNTER — Inpatient Hospital Stay: Payer: 59 | Admitting: Anesthesiology

## 2018-02-21 ENCOUNTER — Encounter
Admission: EM | Disposition: A | Discharge status: Home / Self Care | Payer: Self-pay | Source: Home / Self Care | Attending: Emergency Medicine

## 2018-02-21 ENCOUNTER — Telehealth: Payer: Self-pay | Admitting: Urology

## 2018-02-21 DIAGNOSIS — N132 Hydronephrosis with renal and ureteral calculous obstruction: Secondary | ICD-10-CM | POA: Diagnosis not present

## 2018-02-21 HISTORY — PX: CYSTOSCOPY/URETEROSCOPY/HOLMIUM LASER/STENT PLACEMENT: SHX6546

## 2018-02-21 LAB — CBC
HEMATOCRIT: 36.4 % — AB (ref 40.0–52.0)
HEMOGLOBIN: 12.6 g/dL — AB (ref 13.0–18.0)
MCH: 30.1 pg (ref 26.0–34.0)
MCHC: 34.7 g/dL (ref 32.0–36.0)
MCV: 86.8 fL (ref 80.0–100.0)
Platelets: 204 10*3/uL (ref 150–440)
RBC: 4.2 MIL/uL — AB (ref 4.40–5.90)
RDW: 13.8 % (ref 11.5–14.5)
WBC: 6.5 10*3/uL (ref 3.8–10.6)

## 2018-02-21 LAB — BASIC METABOLIC PANEL
ANION GAP: 8 (ref 5–15)
BUN: 12 mg/dL (ref 8–23)
CHLORIDE: 104 mmol/L (ref 98–111)
CO2: 27 mmol/L (ref 22–32)
Calcium: 8.5 mg/dL — ABNORMAL LOW (ref 8.9–10.3)
Creatinine, Ser: 1.82 mg/dL — ABNORMAL HIGH (ref 0.61–1.24)
GFR calc Af Amer: 45 mL/min — ABNORMAL LOW (ref >60)
GFR calc non Af Amer: 38 mL/min — ABNORMAL LOW (ref >60)
GLUCOSE: 85 mg/dL (ref 70–99)
Potassium: 4.5 mmol/L (ref 3.5–5.1)
Sodium: 139 mmol/L (ref 135–145)

## 2018-02-21 SURGERY — CYSTOSCOPY/URETEROSCOPY/HOLMIUM LASER/STENT PLACEMENT
Anesthesia: General | Laterality: Right

## 2018-02-21 MED ORDER — FENTANYL CITRATE (PF) 100 MCG/2ML IJ SOLN
INTRAMUSCULAR | Status: AC
  Administered 2018-02-21: 25 ug via INTRAVENOUS
  Filled 2018-02-21: qty 2

## 2018-02-21 MED ORDER — OXYCODONE-ACETAMINOPHEN 5-325 MG PO TABS: 1.0000 | ORAL_TABLET | Freq: Four times a day (QID) | ORAL | 0 refills | Status: DC | PRN

## 2018-02-21 MED ORDER — LIDOCAINE HCL (CARDIAC) PF 100 MG/5ML IV SOSY
PREFILLED_SYRINGE | INTRAVENOUS | Status: DC | PRN
  Administered 2018-02-21: 80 mg via INTRAVENOUS

## 2018-02-21 MED ORDER — MIDAZOLAM HCL 2 MG/2ML IJ SOLN
INTRAMUSCULAR | Status: AC
  Filled 2018-02-21: qty 2

## 2018-02-21 MED ORDER — OXYCODONE-ACETAMINOPHEN 5-325 MG PO TABS: 1.0000 | ORAL_TABLET | Freq: Four times a day (QID) | ORAL | Status: DC | PRN

## 2018-02-21 MED ORDER — CEFAZOLIN SODIUM-DEXTROSE 2-3 GM-%(50ML) IV SOLR
INTRAVENOUS | Status: DC | PRN
  Administered 2018-02-21: 2 g via INTRAVENOUS

## 2018-02-21 MED ORDER — TAMSULOSIN HCL 0.4 MG PO CAPS: 0.4000 mg | ORAL_CAPSULE | Freq: Every day | ORAL | 0 refills | Status: DC

## 2018-02-21 MED ORDER — OXYBUTYNIN CHLORIDE 5 MG PO TABS: 5.0000 mg | ORAL_TABLET | Freq: Three times a day (TID) | ORAL | 0 refills | Status: DC | PRN

## 2018-02-21 MED ORDER — LACTATED RINGERS IV SOLN
INTRAVENOUS | Status: DC
  Administered 2018-02-21: 10:00:00 via INTRAVENOUS

## 2018-02-21 MED ORDER — OXYBUTYNIN CHLORIDE 5 MG PO TABS: 5.0000 mg | ORAL_TABLET | Freq: Three times a day (TID) | ORAL | Status: DC | PRN

## 2018-02-21 MED ORDER — FENTANYL CITRATE (PF) 100 MCG/2ML IJ SOLN
INTRAMUSCULAR | Status: DC | PRN
  Administered 2018-02-21 (×3): 25 ug via INTRAVENOUS

## 2018-02-21 MED ORDER — PROPOFOL 10 MG/ML IV BOLUS
INTRAVENOUS | Status: AC
  Filled 2018-02-21: qty 20

## 2018-02-21 MED ORDER — FENTANYL CITRATE (PF) 100 MCG/2ML IJ SOLN
25.0000 ug | INTRAMUSCULAR | Status: DC | PRN
  Administered 2018-02-21 (×2): 25 ug via INTRAVENOUS

## 2018-02-21 MED ORDER — PROPOFOL 10 MG/ML IV BOLUS
INTRAVENOUS | Status: DC | PRN
  Administered 2018-02-21: 100 mg via INTRAVENOUS
  Administered 2018-02-21: 20 mg via INTRAVENOUS

## 2018-02-21 MED ORDER — ONDANSETRON HCL 4 MG/2ML IJ SOLN: 4.0000 mg | Freq: Once | INTRAMUSCULAR | Status: DC | PRN

## 2018-02-21 MED ORDER — MIDAZOLAM HCL 2 MG/2ML IJ SOLN
INTRAMUSCULAR | Status: DC | PRN
  Administered 2018-02-21: 2 mg via INTRAVENOUS

## 2018-02-21 MED ORDER — CEFAZOLIN SODIUM 1 G IJ SOLR
INTRAMUSCULAR | Status: AC
  Filled 2018-02-21: qty 20

## 2018-02-21 MED ORDER — LIDOCAINE HCL (PF) 2 % IJ SOLN
INTRAMUSCULAR | Status: AC
  Filled 2018-02-21: qty 10

## 2018-02-21 MED ORDER — METOPROLOL TARTRATE 25 MG PO TABS: 12.5000 mg | ORAL_TABLET | Freq: Two times a day (BID) | ORAL | Status: DC

## 2018-02-21 MED ORDER — ONDANSETRON HCL 4 MG/2ML IJ SOLN
INTRAMUSCULAR | Status: DC | PRN
  Administered 2018-02-21: 4 mg via INTRAVENOUS

## 2018-02-21 MED ORDER — FENTANYL CITRATE (PF) 100 MCG/2ML IJ SOLN
INTRAMUSCULAR | Status: AC
  Filled 2018-02-21: qty 2

## 2018-02-21 MED ORDER — DEXAMETHASONE SODIUM PHOSPHATE 10 MG/ML IJ SOLN
INTRAMUSCULAR | Status: DC | PRN
  Administered 2018-02-21: 5 mg via INTRAVENOUS

## 2018-02-21 SURGICAL SUPPLY — 29 items
BAG DRAIN CYSTO-URO LG1000N (MISCELLANEOUS) ×2 IMPLANT
BASKET ZERO TIP 1.9FR (BASKET) IMPLANT
BRUSH SCRUB EZ 1% IODOPHOR (MISCELLANEOUS) ×2 IMPLANT
CATH URETL 5X70 OPEN END (CATHETERS) ×2 IMPLANT
CNTNR SPEC 2.5X3XGRAD LEK (MISCELLANEOUS)
CONT SPEC 4OZ STER OR WHT (MISCELLANEOUS)
CONTAINER SPEC 2.5X3XGRAD LEK (MISCELLANEOUS) IMPLANT
DRAPE UTILITY 15X26 TOWEL STRL (DRAPES) ×2 IMPLANT
FIBER LASER LITHO 273 (Laser) ×1 IMPLANT
GLIDEWIRE STIFF .35X180X3 HYDR (WIRE) ×1 IMPLANT
GLOVE BIO SURGEON STRL SZ 6.5 (GLOVE) ×2 IMPLANT
GOWN STRL REUS W/ TWL LRG LVL3 (GOWN DISPOSABLE) ×2 IMPLANT
GOWN STRL REUS W/TWL LRG LVL3 (GOWN DISPOSABLE) ×2
GUIDEWIRE GREEN .038 145CM (MISCELLANEOUS) IMPLANT
GUIDEWIRE SUPER STIFF (WIRE) ×1 IMPLANT
INFUSOR MANOMETER BAG 3000ML (MISCELLANEOUS) ×2 IMPLANT
INTRODUCER DILATOR DOUBLE (INTRODUCER) IMPLANT
KIT TURNOVER CYSTO (KITS) ×2 IMPLANT
PACK CYSTO AR (MISCELLANEOUS) ×2 IMPLANT
SENSORWIRE 0.038 NOT ANGLED (WIRE) ×2
SET CYSTO W/LG BORE CLAMP LF (SET/KITS/TRAYS/PACK) ×2 IMPLANT
SHEATH URETERAL 12FRX35CM (MISCELLANEOUS) IMPLANT
SOL .9 NS 3000ML IRR  AL (IV SOLUTION) ×1
SOL .9 NS 3000ML IRR UROMATIC (IV SOLUTION) ×1 IMPLANT
STENT URET 6FRX24 CONTOUR (STENTS) IMPLANT
STENT URET 6FRX26 CONTOUR (STENTS) IMPLANT
SURGILUBE 2OZ TUBE FLIPTOP (MISCELLANEOUS) ×2 IMPLANT
WATER STERILE IRR 1000ML POUR (IV SOLUTION) ×2 IMPLANT
WIRE SENSOR 0.038 NOT ANGLED (WIRE) ×1 IMPLANT

## 2018-02-21 NOTE — Telephone Encounter (Signed)
-----   Message from Vanna ScotlandAshley Brandon, MD sent at 02/21/2018  2:50 PM EDT ----- Regarding: cysto/ stent Please this please double book this patient at 415 for cystoscopy, stent removal on October 1 with me.  Currently an inpatient but being discharged.  Vanna ScotlandAshley Brandon, MD

## 2018-02-21 NOTE — Discharge Summary (Signed)
[facility] - [place] at [facility]   PATIENT NAME: Elijah [name]    MR#:  [id number]  DATE OF BIRTH:  1955/10/10  DATE OF ADMISSION:  02/20/2018 ADMITTING PHYSICIAN: Alford Highlandichard Samauri Kellenberger, MD  DATE OF DISCHARGE: 02/21/18  PRIMARY CARE PHYSICIAN: Marisue IvanLinthavong, Kanhka, MD    ADMISSION DIAGNOSIS:  Kidney stone [N20.0] Ureterolithiasis [N20.1] Right flank pain [R10.9]  DISCHARGE DIAGNOSIS:  Active Problems:   Acute kidney injury (HCC)   SECONDARY DIAGNOSIS:   Past Medical History:  Diagnosis Date  . Acid reflux   . Anemia   . Anginal pain (HCC)   . Anxiety   . CHF (congestive heart failure) (HCC)   . Coronary artery disease   . Heart attack [facility](HCC) December 2012  . History of panic attacks   . Hyperlipidemia   . Kidney stones   . Presence of permanent cardiac pacemaker   . Restless leg syndrome   . Shortness of breath dyspnea     HOSPITAL COURSE:   1.  Acute kidney injury on chronic kidney disease stage III.  Creatinine about the same today as yesterday.  Would recommend rechecking as outpatient.  Holding Spironolactone for right now. 2.  Right obstructing nephrolithiasis with right hydronephrosis.  Patient had procedure today and stent placement.  Feeling better.  Dr. Apolinar JunesBrandon recommended Flomax daily, as needed Ditropan and Percocet a few tablets. 3.  History of CAD and systolic congestive heart failure.  No signs of heart failure even with giving IV fluids at this point. 4.  Hyperlipidemia unspecified on Lipitor 5.  Essential hypertension on metoprolol.  Hold Spironolactone for now with acute kidney injury. 6.  GERD on PPI and H2 blocker 7.  Note for work.  Return on Sunday  DISCHARGE CONDITIONS:   Satisfactory  CONSULTS OBTAINED:  Treatment Team:  Vanna ScotlandBrandon, Ashley, MD  DRUG ALLERGIES:   Allergies  Allergen Reactions  . Hydrocodone-Acetaminophen Nausea And Vomiting    Causes vomiting and GI upset Causes vomiting and GI upset  . Morphine  Nausea And Vomiting    Causes vomiting and GI upset  . Sulfa Antibiotics Rash    DISCHARGE MEDICATIONS:   Allergies as of 02/21/2018      Reactions   Hydrocodone-acetaminophen Nausea And Vomiting   Causes vomiting and GI upset Causes vomiting and GI upset   Morphine Nausea And Vomiting   Causes vomiting and GI upset   Sulfa Antibiotics Rash      Medication List    STOP taking these medications   spironolactone 25 MG tablet Commonly known as:  ALDACTONE     TAKE these medications   aspirin EC 81 MG tablet Take 81 mg by mouth daily.   atorvastatin 80 MG tablet Commonly known as:  LIPITOR Take 80 mg by mouth daily at 6 PM.   EFFIENT 10 MG Tabs tablet Generic drug:  prasugrel Take 10 mg by mouth daily. Reported on 09/09/2015   metoprolol tartrate 25 MG tablet Commonly known as:  LOPRESSOR Take 1 tablet (25 mg total) by mouth 2 (two) times daily.   multivitamin tablet Take 1 tablet by mouth daily.   omeprazole 20 MG tablet Commonly known as:  PRILOSEC OTC Take 20 mg by mouth daily.   oxybutynin 5 MG tablet Commonly known as:  DITROPAN Take 1 tablet (5 mg total) by mouth every 8 (eight) hours as needed for bladder spasms.   oxyCODONE-acetaminophen 5-325 MG tablet Commonly known as:  PERCOCET/ROXICET Take 1 tablet by mouth every 6 (six) hours  as needed for severe pain.   RANEXA 1000 MG SR tablet Generic drug:  ranolazine Take 1,000 mg by mouth 2 (two) times daily.   ranitidine 75 MG tablet Commonly known as:  ZANTAC Take 75 mg by mouth 2 (two) times daily.   tamsulosin 0.4 MG Caps capsule Commonly known as:  FLOMAX Take 1 capsule (0.4 mg total) by mouth daily.   vitamin B-12 1000 MCG tablet Commonly known as:  CYANOCOBALAMIN Take 1 tablet by mouth daily.        DISCHARGE INSTRUCTIONS:   Follow-up PMD 6 days Follow-up with Dr. Apolinar Junes as outpatient  If you experience worsening of your admission symptoms, develop shortness of breath, life  threatening emergency, suicidal or homicidal thoughts you must seek medical attention immediately by calling 911 or calling your MD immediately  if symptoms less severe.  You Must read complete instructions/literature along with all the possible adverse reactions/side effects for all the Medicines you take and that have been prescribed to you. Take any new Medicines after you have completely understood and accept all the possible adverse reactions/side effects.   Please note  You were cared for by a hospitalist during your hospital stay. If you have any questions about your discharge medications or the care you received while you were in the hospital after you are discharged, you can call the unit and asked to speak with the hospitalist on call if the hospitalist that took care of you is not available. Once you are discharged, your primary care physician will handle any further medical issues. Please note that NO REFILLS for any discharge medications will be authorized once you are discharged, as it is imperative that you return to your primary care physician (or establish a relationship with a primary care physician if you do not have one) for your aftercare needs so that they can reassess your need for medications and monitor your lab values.    Today   CHIEF COMPLAINT:   Chief Complaint  Patient presents with  . Flank Pain    HISTORY OF PRESENT ILLNESS:  Elijah [name]  is a 62 y.o. male presented with flank pain and found to have hydronephrosis and obstructing kidney stone and acute kidney injury   VITAL SIGNS:  Blood pressure 138/80, pulse 67, temperature (!) 97.4 F (36.3 C), temperature source Oral, resp. rate 18, height 5\' 7"  (1.702 m), weight 83.9 kg, SpO2 95 %.    PHYSICAL EXAMINATION:  GENERAL:  62 y.o.-year-old patient lying in the bed with no acute distress.  EYES: Pupils equal, round, reactive to light and accommodation. No scleral icterus. Extraocular muscles intact.   HEENT: Head atraumatic, normocephalic. Oropharynx and nasopharynx clear.  NECK:  Supple, no jugular venous distention. No thyroid enlargement, no tenderness.  LUNGS: Normal breath sounds bilaterally, no wheezing, rales,rhonchi or crepitation. No use of accessory muscles of respiration.  CARDIOVASCULAR: S1, S2 normal. No murmurs, rubs, or gallops.  ABDOMEN: Soft, non-tender, non-distended. Bowel sounds present. No organomegaly or mass.  EXTREMITIES: No pedal edema, cyanosis, or clubbing.  NEUROLOGIC: Cranial nerves II through XII are intact. Muscle strength 5/5 in all extremities. Sensation intact. Gait not checked.  PSYCHIATRIC: The patient is alert and oriented x 3.  SKIN: No obvious rash, lesion, or ulcer.   DATA REVIEW:   CBC Recent Labs  Lab 02/21/18 0453  WBC 6.5  HGB 12.6*  HCT 36.4*  PLT 204    Chemistries  Recent Labs  Lab 02/21/18 0453  NA 139  K  4.5  CL 104  CO2 27  GLUCOSE 85  BUN 12  CREATININE 1.82*  CALCIUM 8.5*     Microbiology Results  Results for orders placed or performed during the hospital encounter of 02/20/18  Surgical PCR screen     Status: None   Collection Time: 02/20/18  3:26 PM  Result Value Ref Range Status   MRSA, PCR NEGATIVE NEGATIVE Final   Staphylococcus aureus NEGATIVE NEGATIVE Final    Comment: (NOTE) The Xpert SA Assay (FDA approved for NASAL specimens in patients 37 years of age and older), is one component of a comprehensive surveillance program. It is not intended to diagnose infection nor to guide or monitor treatment. Performed at [facility], [address] [place], [place] [address]     RADIOLOGY:  Dg Abdomen 1 View  Result Date: 02/20/2018 CLINICAL DATA:  Initial evaluation for acute right flank pain. History of kidney stones. EXAM: ABDOMEN - 1 VIEW COMPARISON:  Prior CT from 12/17/2017. FINDINGS: Bowel gas pattern within normal limits without evidence for obstruction. Moderate stool within the  ascending colon. 9 mm calcific density overlies the expected location of the mid-distal right ureter, which could reflect ureterolithiasis. Adjacent smaller 3 mm calcification noted as well. Few additional punctate calcific densities overlying the right renal shadow likely reflect additional renal calculi. No definite left-sided stones. No acute osseous abnormality. IMPRESSION: 1. Few calculi measuring up to 9 mm overlying the expected location of the mid-distal right ureter, suspicious for right ureterolithiasis given history of right flank pain. Finding could be further assessed with cross-sectional imaging as clinically desired. 2. Probable additional subcentimeter right renal nephrolithiasis. 3. Nonobstructive bowel gas pattern. Electronically Signed   By: Rise Mu M.D.   On: 02/20/2018 06:02   [place] Intraoperative  Result Date: 02/21/2018 CLINICAL DATA:  Ultrasound was provided for use by the ordering physician, and a technical charge was applied by the performing facility.  No radiologist interpretation/professional services rendered.   Dg C-arm 1-60 Min-no Report  Result Date: 02/21/2018 Fluoroscopy was utilized by the requesting physician.  No radiographic interpretation.   Ct Renal Stone Study  Result Date: 02/20/2018 CLINICAL DATA:  62 year old male with history of right-sided flank pain for the past 5 days accompanied by nausea. History of kidney stones status post lithotripsy. EXAM: CT ABDOMEN AND PELVIS WITHOUT CONTRAST TECHNIQUE: Multidetector CT imaging of the abdomen and pelvis was performed following the standard protocol without IV contrast. COMPARISON:  CT the abdomen and pelvis 12/17/2017. FINDINGS: Lower chest: Coronary artery stent in the right coronary artery. Pacemaker lead terminating in the right ventricle near the apex. Hepatobiliary: Small low-attenuation lesions in the liver are incompletely characterized on today's noncontrast CT examination, but similar in size and  appearance to prior studies and favored to represent tiny cysts. No larger more suspicious appearing hepatic lesions are noted. Unenhanced appearance of the gallbladder is normal. Pancreas: No definite pancreatic mass or peripancreatic fluid or inflammatory changes are noted on today's noncontrast CT examination. Spleen: Unremarkable. Adrenals/Urinary Tract: Multiple nonobstructive calculi are noted within the collecting systems of both kidneys measuring up to 4 mm in the lower pole collecting system of the right kidney. In addition, in the right ureter there are 2 calculi in the mid ureter, largest of which measures 10 x 5 x 5 mm. This is associated with moderate proximal right hydroureteronephrosis. No left-sided ureteral stones are identified. No stones within the urinary bladder. Bilateral adrenal glands are normal in appearance. Stomach/Bowel: Unenhanced appearance of  the stomach is normal. Small duodenal diverticulum extending off the posterior aspect of the second portion of the duodenum incidentally noted. No pathologic dilatation of small bowel or colon. Normal appendix. Vascular/Lymphatic: Aortic atherosclerosis. Retroaortic left renal vein (normal anatomical variant) incidentally noted. No lymphadenopathy noted in the abdomen or pelvis. Reproductive: Prostate gland and seminal vesicles are unremarkable in appearance. Other: No significant volume of ascites.  No pneumoperitoneum. Musculoskeletal: There are no aggressive appearing lytic or blastic lesions noted in the visualized portions of the skeleton. IMPRESSION: 1. There are 2 calculi in the middle third of the right ureter, largest of which measures 10 x 5 x 5 mm. This is associated with moderate proximal hydroureteronephrosis. Several additional nonobstructive calculi are noted within the collecting systems of both kidneys measuring up to 4 mm in the lower pole collecting system of the right kidney. 2. Aortic atherosclerosis. 3. Additional incidental  findings, as above. Electronically Signed   By: Trudie Reed M.D.   On: 02/20/2018 07:17     Management plans discussed with the patient, family and they are in agreement.  CODE STATUS:     Code Status Orders  (From admission, onward)         Start     Ordered   02/20/18 1010  Full code  Continuous     02/20/18 1010        Code Status History    This patient has a current code status but no historical code status.      TOTAL TIME TAKING CARE OF THIS PATIENT: 35 minutes.    Alford Highland M.D on 02/21/2018 at 3:22 PM  Between 7am to 6pm - Pager - [phone]  After 6pm go to [email or web address] - password [email or web address]  [facility] Office  [phone]  CC: Primary care physician; Marisue Ivan, MD

## 2018-02-21 NOTE — Anesthesia Procedure Notes (Signed)
Procedure Name: LMA Insertion Date/Time: 02/21/2018 11:03 AM Performed by: Dava NajjarFrazier, Beyonce Sawatzky, CRNA Pre-anesthesia Checklist: Patient identified, Emergency Drugs available, Suction available, Patient being monitored and Timeout performed Patient Re-evaluated:Patient Re-evaluated prior to induction Oxygen Delivery Method: Circle system utilized Preoxygenation: Pre-oxygenation with 100% oxygen Induction Type: IV induction Ventilation: Mask ventilation without difficulty LMA: LMA inserted LMA Size: 4.0 Number of attempts: 1 Placement Confirmation: breath sounds checked- equal and bilateral and positive ETCO2 Tube secured with: Tape Dental Injury: Teeth and Oropharynx as per pre-operative assessment

## 2018-02-21 NOTE — Op Note (Signed)
Date of procedure: 02/21/18  Preoperative diagnosis:  1. Right ureteral stone 2. Right hydronephrosis  Postoperative diagnosis:  1. Same as above  Procedure: 1. Right ureteroscopy 2. Laser lithotripsy 3. Right ureteral stent placement 4. Right retrograde pyelogram  Surgeon: Vanna ScotlandAshley Tenae Graziosi, MD  Anesthesia: General  Complications: None  Intraoperative findings: Relatively impacted right mid ureteral stone x2, difficulty placing safety wire, ultimately successful.  Stone retropulsed into the collecting system, fragmented using dusting settings.  EBL: Minimal  Specimens: None  Drains: 6 x 26 French double-J ureteral stent on right  Indication: Elijah [name] is a 62 y.o. patient with a 1 cm as well as smaller right mid distal ureteral calculus with proximal hydroureteronephrosis and pain control issues.  After reviewing the management options for treatment, he elected to proceed with the above surgical procedure(s). We have discussed the potential benefits and risks of the procedure, side effects of the proposed treatment, the likelihood of the patient achieving the goals of the procedure, and any potential problems that might occur during the procedure or recuperation. Informed consent has been obtained.  Description of procedure:  The patient was taken to the operating room and general anesthesia was induced.  The patient was placed in the dorsal lithotomy position, prepped and draped in the usual sterile fashion, and preoperative antibiotics were administered. A preoperative time-out was performed.   At this point time, cystoscope was placed.  5 [place] open-ended ureteral catheter was intubated just within the UO.  At this point time, is prepared to perform a retrograde pyelogram however, unfortunately the fluoroscopy unit on the cystoscopy bed was malfunctioning.  We spent a good amount time trying to fix this but ultimately unsuccessful.  Bedside ultrasound was brought in and  I then attempted to advance a wire under ultrasound guidance but was unable to pass the wire alongside the stone presumably secondary to impaction.  At this point time, fluoroscopy was deemed absolutely necessary in order to be successful in this case.  As such, the patient was transitioned to his hospital bed but remained under anesthesia.  He was transported to an alternative room and repositioned on the new bed.  A C-arm was then brought in and he was reprepped and draped.  This point time, a retrograde pyelogram was performed showing a high-grade ureteral obstruction at the level of the stone with a filling defect at this level.  Attempted to place an angled Glidewire unsuccessfully around the stone despite multiple maneuvers.  Ultimately, I created a slurry of lubricant, contrast and saline and upon doing this, believe that the stone may have dislodged and retropulsed to level of the kidney.  After this, I was able to get a wire to the level of the renal pelvis successfully.  A 5 French open-ended ureteral catheter was advanced to the proximal ureter and a retrograde pyelogram was performed confirming its adequate position within the dilated collecting system.  A sensor wire was then replaced and the 5 [place] open-ended ureteral catheter was removed.  A 4.5 semirigid long ureteroscope was then advanced up to level where the stone had been and no stone was identified.  There was some tortuosity and a significant amount of ureteral edema with a small mucosal abrasion at this level presumably secondary to manipulation.  The stone itself, however is no longer present.  It was also not seen on fluoroscopy at this level as it been seen on scout imaging.  A consultation could be seen up in the kidney.  Under  direct visualization, a Super Stiff wire was advanced up to level the kidney as a working wire.  I then advanced a 7 [place] digital ureteroscope up to the renal pelvis where ultimately the stone was encountered  and pushed into a midpole calyx.  In fact both of the stones were at this level.  A 273 m laser fiber was then brought in and using dusting settings of 0.2 J and 40 Hz, the stone was fragmented until visually all pieces were quite small, not much greater than the size of the tip of the laser fiber as well as no longer visible on fluoroscopic imaging.  Each every calyx was then directly visualized.  Another small nonobstructing stone was also treated.  A final retrograde pyelogram was performed to create a roadmap of the kidney.  Each every calyx was regular visualized and there were no significant stones identified.  The scope was then backed down the length the ureter inspecting along the way.  Again there was some edema and a small mucosal abrasion without contrast extravasation at the level of the previously lodged mid ureteral stone.  The safety wire was then backloaded over rigid cystoscope.  A 6 x 26 French double-J ureteral stent was advanced over the wire up to level the renal pelvis.  The wires partially drawn until focal stone within the renal pelvis.  The wires only fully withdrawn a full coils noted within the bladder.  The bladder was then drained.  Patient was then clean and dry, repositioned in supine position, reversed from anesthesia, taken to PACU in stable condition.  Plan: Patient's diet may be advanced.  He may be discharged later today if his pain is adequately controlled.  We will arrange for cystoscopy, stent removal in 7 to 10 days in our office.  This plan was communicated to Dr. Renae Gloss.  Notably, the patient did experience prolonged anesthesia time secondary to equipment malfunction today.  Vanna Scotland, M.D.

## 2018-02-21 NOTE — Anesthesia Post-op Follow-up Note (Signed)
Anesthesia QCDR form completed.        

## 2018-02-21 NOTE — Progress Notes (Signed)
Patient cleared for d/c by DR Apolinar JunesBrandon and [employer]. Belongings gathered. IV removed. Pharmacy verified. Instructions given. D/c to home via POV

## 2018-02-21 NOTE — OR Nursing (Signed)
Due to bed complications and xray was not working, the patient was moved via his hospital bed, intubated to room 5. The patient was transferred to the OR bed in room 5 and positioned accordingly, surgeon approved. The patient was re prepped, draped and procedure was resumed.

## 2018-02-21 NOTE — Discharge Instructions (Addendum)
Can hold spironolactone for a few days  If bleeding,  can hold Effient and aspirin for a few days.   Likely would have to hold effient for a few days prior to stent removal also.

## 2018-02-21 NOTE — Transfer of Care (Signed)
Immediate Anesthesia Transfer of Care Note  Patient: Elijah [name]  Procedure(s) Performed: CYSTOSCOPY/URETEROSCOPY/HOLMIUM LASER/STENT PLACEMENT (Right )  Patient Location: PACU  Anesthesia Type:General  Level of Consciousness: drowsy  Airway & Oxygen Therapy: Patient Spontanous Breathing and Patient connected to face mask oxygen  Post-op Assessment: Report given to RN and Post -op Vital signs reviewed and stable  Post vital signs: Reviewed and stable  Last Vitals:  Vitals Value Taken Time  BP 134/84 02/21/2018  1:28 PM  Temp 36.9 C 02/21/2018  1:28 PM  Pulse 72 02/21/2018  1:32 PM  Resp 19 02/21/2018  1:32 PM  SpO2 100 % 02/21/2018  1:32 PM  Vitals shown include unvalidated device data.  Last Pain:  Vitals:   02/21/18 1328  TempSrc:   PainSc: Asleep         Complications: No apparent anesthesia complications

## 2018-02-21 NOTE — Interval H&P Note (Signed)
History and Physical Interval Note:  02/21/2018 10:40 AM  Elijah LankPhillip A Derhammer  has presented today for surgery, with the diagnosis of RIGHT URETERAL STONE  The various methods of treatment have been discussed with the patient and family. After consideration of risks, benefits and other options for treatment, the patient has consented to  Procedure(s): CYSTOSCOPY/URETEROSCOPY/HOLMIUM LASER/STENT PLACEMENT (Right) as a surgical intervention .  The patient's history has been reviewed, patient examined, no change in status, stable for surgery.  I have reviewed the patient's chart and labs.  Questions were answered to the patient's satisfaction.    RRR CTAB  Vanna ScotlandAshley Edu On

## 2018-02-21 NOTE — Anesthesia Postprocedure Evaluation (Signed)
Anesthesia Post Note  Patient: Elijah [name]  Procedure(s) Performed: CYSTOSCOPY/URETEROSCOPY/HOLMIUM LASER/STENT PLACEMENT (Right )  Patient location during evaluation: PACU Anesthesia Type: General Level of consciousness: awake and alert Pain management: pain level controlled Vital Signs Assessment: post-procedure vital signs reviewed and stable Respiratory status: spontaneous breathing and respiratory function stable Cardiovascular status: stable Anesthetic complications: no     Last Vitals:  Vitals:   02/21/18 0937 02/21/18 1328  BP: 125/85 134/84  Pulse: 67 71  Resp: 12 14  Temp: (!) 36.2 C 36.9 C  SpO2: 98% 100%    Last Pain:  Vitals:   02/21/18 1328  TempSrc:   PainSc: Asleep                 Terralyn Matsumura K

## 2018-02-21 NOTE — Telephone Encounter (Signed)
App made and mailed to patient ° °mb °

## 2018-02-22 ENCOUNTER — Encounter: Payer: Self-pay | Admitting: Urology

## 2018-02-22 LAB — HIV ANTIBODY (ROUTINE TESTING W REFLEX): HIV SCREEN 4TH GENERATION: NONREACTIVE

## 2018-03-05 ENCOUNTER — Ambulatory Visit (INDEPENDENT_AMBULATORY_CARE_PROVIDER_SITE_OTHER): Payer: 59 | Admitting: Urology

## 2018-03-05 ENCOUNTER — Encounter: Payer: Self-pay | Admitting: Urology

## 2018-03-05 VITALS — BP 135/85 | HR 86 | Ht 67.0 in | Wt 184.0 lb

## 2018-03-05 DIAGNOSIS — N200 Calculus of kidney: Secondary | ICD-10-CM

## 2018-03-05 DIAGNOSIS — N201 Calculus of ureter: Secondary | ICD-10-CM

## 2018-03-05 LAB — URINALYSIS, COMPLETE
Bilirubin, UA: NEGATIVE
GLUCOSE, UA: NEGATIVE
Ketones, UA: NEGATIVE
Nitrite, UA: NEGATIVE
PH UA: 5.5 (ref 5.0–7.5)
Specific Gravity, UA: <1.005 — ABNORMAL LOW (ref 1.005–1.030)
UUROB: 0.2 mg/dL (ref 0.2–1.0)

## 2018-03-05 LAB — MICROSCOPIC EXAMINATION
Epithelial Cells (non renal): NONE SEEN /hpf (ref 0–10)
RBC, UA: >30 /hpf — ABNORMAL HIGH (ref 0–2)

## 2018-03-05 MED ORDER — LIDOCAINE HCL URETHRAL/MUCOSAL 2 % EX GEL
1.0000 "application " | Freq: Once | CUTANEOUS | Status: AC
  Administered 2018-03-05: 1 via URETHRAL

## 2018-03-05 MED ORDER — CIPROFLOXACIN HCL 500 MG PO TABS
500.0000 mg | ORAL_TABLET | Freq: Once | ORAL | Status: AC
  Administered 2018-03-05: 500 mg via ORAL

## 2018-03-05 NOTE — Progress Notes (Signed)
   03/05/18  CC:  Chief Complaint  Patient presents with  . Cysto Stent Removal    HPI: 62 year old male with a right ureteral stone x 2 (impacted) who underwent ureteroscopy with laser lithotripsy on 02/21/2017.  He returns today for cystoscopy, stent removal.  He does report that he has had intermittent dysuria and hematuria.  UA today consistent with known stent, and not particularly suspicious for infection.  No fevers or chills.   Blood pressure 135/85, pulse 86, height 5\' 7"  (1.702 m), weight 184 lb (83.5 kg). NED. A&Ox3.   No respiratory distress   Abd soft, NT, ND Normal phallus with bilateral descended testicles  Cystoscopy/ Stent removal procedure  Patient identification was confirmed, informed consent was obtained, and patient was prepped using Betadine solution.  Lidocaine jelly was administered per urethral meatus.    Preoperative abx where received prior to procedure.    Procedure: - Flexible cystoscope introduced, without any difficulty.   - Thorough search of the bladder revealed:    normal urethral meatus  Stent seen emanating from right ureteral orifice, grasped with stent graspers, and removed in entirety.   Post-Procedure: - Patient tolerated the procedure well   Assessment/ Plan:  1. Right ureteral stone Stent removed today without difficulty Follow-up in 4 weeks with renal ultrasound  We will review stone diet/stone prevention techniques next visit Warning symptoms reviewed - Urinalysis, Complete - ciprofloxacin (CIPRO) tablet 500 mg - lidocaine (XYLOCAINE) 2 % jelly 1 application - US RENAL; Future  Return in about 4 weeks (around 04/02/2018) for RUS with me or Carollee Herter.  Vanna Scotland, MD

## 2018-04-01 ENCOUNTER — Ambulatory Visit
Admission: RE | Admit: 2018-04-01 | Discharge: 2018-04-01 | Disposition: A | Discharge status: Home / Self Care | Payer: 59 | Source: Ambulatory Visit | Attending: Urology | Admitting: Urology

## 2018-04-01 DIAGNOSIS — N201 Calculus of ureter: Secondary | ICD-10-CM | POA: Insufficient documentation

## 2018-04-01 DIAGNOSIS — N200 Calculus of kidney: Secondary | ICD-10-CM | POA: Diagnosis not present

## 2018-04-02 ENCOUNTER — Ambulatory Visit: Payer: 59 | Admitting: Urology

## 2018-04-02 ENCOUNTER — Encounter: Payer: Self-pay | Admitting: Urology

## 2018-04-02 VITALS — BP 153/88 | HR 71 | Ht 68.0 in | Wt 175.0 lb

## 2018-04-02 DIAGNOSIS — N200 Calculus of kidney: Secondary | ICD-10-CM | POA: Diagnosis not present

## 2018-04-02 NOTE — Progress Notes (Signed)
04/02/2018 12:01 PM   Elijah [name] 10/28/1955 [id number]  Referring provider: Marisue Ivan, MD [phone] [facility] [address] [facility] [place], [place] [address]  Chief Complaint  Patient presents with  . Results    4wk w/RUS    HPI: 62 year old male with a history of recurrent nephrolithiasis and was recently taken to the operating room during inpatient admission for poorly controlled right flank pain secondary to obstructing ureteral stone.    Intraoperatively at the time of right-sided ureteroscopy on 02/20/2018, the stone was noted to be relatively impacted and his stent was left in for approximately 10 days postop and subsequently removed.  Follow-up renal ultrasound on 03/24/2018 shows complete resolution of hydronephrosis as well as multiple small right lower pole stones not treated at the time of the ureteral stone.  Incidental prostamegaly was also noted.  Patient reports that he has had multiple stone episodes over the past year most of which are passed spontaneously.  He is also had multiple operative procedures in the past as well, most recently 2016 by Dr. Marlou Porch.  The stone composition 95% calcium oxalate monohydrate, 5% calcium phosphate.  He is never previously had a metabolic stone work-up.  He does try to drink plenty water in the daytime.  He tries to avoid salt due to his cardiac history.  , He is doing well.  He denies any significant urinary symptoms.  He denies any flank pain or gross hematuria.  PMH: Past Medical History:  Diagnosis Date  . Acid reflux   . Anemia   . Anginal pain (HCC)   . Anxiety   . CHF (congestive heart failure) (HCC)   . Coronary artery disease   . Heart attack [facility]) December 2012  . History of panic attacks   . Hyperlipidemia   . Kidney stones   . Presence of permanent cardiac pacemaker   . Restless leg syndrome   . Shortness of breath dyspnea     Surgical History: Past Surgical History:  Procedure  Laterality Date  . CARDIAC CATHETERIZATION    . CORONARY ANGIOPLASTY    . CORONARY STENT PLACEMENT    . CYSTOSCOPY W/ RETROGRADES Left 03/16/2015   Procedure: CYSTOSCOPY WITH RETROGRADE PYELOGRAM;  Surgeon: Crist Fat, MD;  Location: [facility] ORS;  Service: Urology;  Laterality: Left;  . CYSTOSCOPY WITH STENT PLACEMENT Left 03/16/2015   Procedure: CYSTOSCOPY WITH STENT PLACEMENT;  Surgeon: Crist Fat, MD;  Location: [facility] ORS;  Service: Urology;  Laterality: Left;  . CYSTOSCOPY/URETEROSCOPY/HOLMIUM LASER/STENT PLACEMENT Left 03/16/2015   Procedure: CYSTOSCOPY/URETEROSCOPY/HOLMIUM LASER/STENT PLACEMENT;  Surgeon: Crist Fat, MD;  Location: [facility] ORS;  Service: Urology;  Laterality: Left;  . CYSTOSCOPY/URETEROSCOPY/HOLMIUM LASER/STENT PLACEMENT Right 02/21/2018   Procedure: CYSTOSCOPY/URETEROSCOPY/HOLMIUM LASER/STENT PLACEMENT;  Surgeon: Vanna Scotland, MD;  Location: [facility] ORS;  Service: Urology;  Laterality: Right;  . EXTRACORPOREAL SHOCK WAVE LITHOTRIPSY    . INSERT / REPLACE / REMOVE PACEMAKER    . PACEMAKER INSERTION      Home Medications:  Allergies as of 04/02/2018      Reactions   Hydrocodone-acetaminophen Nausea And Vomiting   Causes vomiting and GI upset Causes vomiting and GI upset   Morphine Nausea And Vomiting   Causes vomiting and GI upset   Sulfa Antibiotics Rash      Medication List        Accurate as of 04/02/18 11:59 PM. Always use your most recent med list.          aspirin EC 81 MG  tablet Take 81 mg by mouth daily.   atorvastatin 80 MG tablet Commonly known as:  LIPITOR Take 80 mg by mouth daily at 6 PM.   EFFIENT 10 MG Tabs tablet Generic drug:  prasugrel Take 10 mg by mouth daily. Reported on 09/09/2015   metoprolol tartrate 25 MG tablet Commonly known as:  LOPRESSOR Take 1 tablet (25 mg total) by mouth 2 (two) times daily.   RANEXA 1000 MG SR tablet Generic drug:  ranolazine Take 1,000 mg by mouth 2 (two) times daily.     ranitidine 75 MG tablet Commonly known as:  ZANTAC Take 75 mg by mouth 2 (two) times daily.   vitamin B-12 1000 MCG tablet Commonly known as:  CYANOCOBALAMIN Take 1 tablet by mouth daily.       Allergies:  Allergies  Allergen Reactions  . Hydrocodone-Acetaminophen Nausea And Vomiting    Causes vomiting and GI upset Causes vomiting and GI upset  . Morphine Nausea And Vomiting    Causes vomiting and GI upset  . Sulfa Antibiotics Rash    Family History: Family History  Problem Relation Age of Onset  . CAD Mother   . Emphysema Father   . Urolithiasis Neg Hx   . Prostate cancer Neg Hx   . Kidney disease Neg Hx   . Kidney cancer Neg Hx     Social History:  reports that he quit smoking about 7 years ago. His smoking use included cigarettes. He smoked 2.00 packs per day. He has never used smokeless tobacco. He reports that he does not drink alcohol or use drugs.  ROS: UROLOGY Frequent Urination?: No Hard to postpone urination?: No Burning/pain with urination?: No Get up at night to urinate?: No Leakage of urine?: No Urine stream starts and stops?: No Trouble starting stream?: No Do you have to strain to urinate?: No Blood in urine?: No Urinary tract infection?: No Sexually transmitted disease?: No Injury to kidneys or bladder?: No Painful intercourse?: No Weak stream?: No Erection problems?: No Penile pain?: No  Gastrointestinal Nausea?: No Vomiting?: No Indigestion/heartburn?: No Diarrhea?: No Constipation?: No  Constitutional Fever: No Night sweats?: No Weight loss?: No Fatigue?: No  Skin Skin rash/lesions?: No Itching?: No  Eyes Blurred vision?: No Double vision?: No  Ears/Nose/Throat Sore throat?: No Sinus problems?: No  Hematologic/Lymphatic Swollen glands?: No Easy bruising?: No  Cardiovascular Leg swelling?: No Chest pain?: No  Respiratory Cough?: No Shortness of breath?: No  Endocrine Excessive thirst?:  No  Musculoskeletal Back pain?: No Joint pain?: No  Neurological Headaches?: No Dizziness?: No  Psychologic Depression?: No Anxiety?: No  Physical Exam: BP (!) 153/88   Pulse 71   Ht 5\' 8"  (1.727 m)   Wt 175 lb (79.4 kg)   BMI 26.61 kg/m   Constitutional:  Alert and oriented, No acute distress. HEENT: [place] AT, moist mucus membranes.  Trachea midline, no masses. Cardiovascular: No clubbing, cyanosis, or edema. Respiratory: Normal respiratory effort, no increased work of breathing. Skin: No rashes, bruises or suspicious lesions. Neurologic: Grossly intact, no focal deficits, moving all 4 extremities. Psychiatric: Normal mood and affect.  Laboratory Data: Lab Results  Component Value Date   WBC 6.5 02/21/2018   HGB 12.6 (L) 02/21/2018   HCT 36.4 (L) 02/21/2018   MCV 86.8 02/21/2018   PLT 204 02/21/2018    Lab Results  Component Value Date   CREATININE 1.82 (H) 02/21/2018   Urinalysis NA  Pertinent Imaging: Results for orders placed during the hospital encounter of 04/01/18  US RENAL   Narrative CLINICAL DATA:  Status post ureteroscopy  EXAM: RENAL / URINARY TRACT ULTRASOUND COMPLETE  COMPARISON:  CT abdomen/pelvis dated 02/20/2018  FINDINGS: Right Kidney:  Length: 9.9 cm. Multiple lower pole calculi measuring up to 6 mm. No hydronephrosis.  Left Kidney:  Length: 9.9 cm.  No mass or hydronephrosis.  Bladder:  Small postvoid residual (60 mL). Otherwise within normal limits. Bilateral bladder jets are visualized.  Additional comments: Suspected prostatomegaly, indenting the base of the bladder.  IMPRESSION: Multiple right lower pole renal calculi measuring up to 6 mm. No hydronephrosis.  Small postvoid bladder residual.  Suspected prostatomegaly, indenting the base of the bladder.   Electronically Signed   By: Charline Bills M.D.   On: 04/01/2018 21:35    Renal ultrasound personally reviewed.  Assessment & Plan:    1. Recurrent  nephrolithiasis Recurrent nephrolithiasis status post recent right ureteroscopy Follow-up renal ultrasound reviewed, no evidence of iatrogenic hydro-nephrosis Reviewed given that he is having more frequent stone episodes and once per year, of metabolic stone evaluation is warranted We reviewed how to perform this study and he will follow-up for these results We discussed general stone prevention techniques including drinking plenty water with goal of producing 2.5 L urine daily, increased citric acid intake, avoidance of high oxalate containing foods, and decreased salt intake.  Information about dietary recommendations given today.    Return in about 3 months (around 07/03/2018) for shannon to review 24 hour urine .  Vanna Scotland, MD  [facility] [address], [address] [place], [place] [address] [phone]

## 2018-07-02 ENCOUNTER — Ambulatory Visit: Payer: 59 | Admitting: Urology

## 2019-03-30 DIAGNOSIS — I472 Ventricular tachycardia, unspecified: Secondary | ICD-10-CM | POA: Insufficient documentation

## 2019-05-12 ENCOUNTER — Other Ambulatory Visit: Payer: Self-pay | Admitting: Physician Assistant

## 2019-05-12 DIAGNOSIS — Z01818 Encounter for other preprocedural examination: Secondary | ICD-10-CM

## 2019-05-12 NOTE — Progress Notes (Signed)
Covid testing ordered for screening prior to cardiac catheterization.

## 2019-05-16 ENCOUNTER — Other Ambulatory Visit: Admission: RE | Admit: 2019-05-16 | Payer: 59 | Source: Ambulatory Visit

## 2019-05-21 ENCOUNTER — Encounter: Admission: RE | Payer: Self-pay | Source: Home / Self Care

## 2019-05-21 ENCOUNTER — Ambulatory Visit: Admission: RE | Admit: 2019-05-21 | Payer: 59 | Source: Home / Self Care | Admitting: Internal Medicine

## 2019-05-21 SURGERY — LEFT HEART CATH AND CORONARY ANGIOGRAPHY
Anesthesia: Moderate Sedation | Laterality: Left

## 2020-07-08 ENCOUNTER — Other Ambulatory Visit: Payer: Self-pay | Admitting: Cardiology

## 2020-07-21 ENCOUNTER — Other Ambulatory Visit: Payer: Self-pay

## 2020-07-21 ENCOUNTER — Other Ambulatory Visit
Admission: RE | Admit: 2020-07-21 | Discharge: 2020-07-21 | Disposition: A | Discharge status: Home / Self Care | Payer: 59 | Source: Ambulatory Visit | Attending: Cardiology | Admitting: Cardiology

## 2020-07-21 DIAGNOSIS — Z20822 Contact with and (suspected) exposure to covid-19: Secondary | ICD-10-CM | POA: Insufficient documentation

## 2020-07-21 DIAGNOSIS — Z01812 Encounter for preprocedural laboratory examination: Secondary | ICD-10-CM | POA: Diagnosis present

## 2020-07-21 LAB — SARS CORONAVIRUS 2 (TAT 6-24 HRS): SARS Coronavirus 2: NEGATIVE

## 2020-07-23 ENCOUNTER — Encounter
Admission: RE | Disposition: A | Discharge status: Home / Self Care | Payer: Self-pay | Source: Home / Self Care | Attending: Cardiology

## 2020-07-23 ENCOUNTER — Encounter: Payer: Self-pay | Admitting: Cardiology

## 2020-07-23 ENCOUNTER — Ambulatory Visit
Admission: RE | Admit: 2020-07-23 | Discharge: 2020-07-23 | Disposition: A | Discharge status: Home / Self Care | Payer: 59 | Attending: Cardiology | Admitting: Cardiology

## 2020-07-23 ENCOUNTER — Other Ambulatory Visit: Payer: Self-pay | Admitting: Cardiology

## 2020-07-23 ENCOUNTER — Other Ambulatory Visit: Payer: Self-pay

## 2020-07-23 DIAGNOSIS — I2510 Atherosclerotic heart disease of native coronary artery without angina pectoris: Secondary | ICD-10-CM | POA: Diagnosis not present

## 2020-07-23 DIAGNOSIS — Z955 Presence of coronary angioplasty implant and graft: Secondary | ICD-10-CM | POA: Diagnosis not present

## 2020-07-23 DIAGNOSIS — Z4502 Encounter for adjustment and management of automatic implantable cardiac defibrillator: Secondary | ICD-10-CM | POA: Diagnosis not present

## 2020-07-23 DIAGNOSIS — I5022 Chronic systolic (congestive) heart failure: Secondary | ICD-10-CM | POA: Diagnosis not present

## 2020-07-23 DIAGNOSIS — I255 Ischemic cardiomyopathy: Secondary | ICD-10-CM | POA: Insufficient documentation

## 2020-07-23 DIAGNOSIS — Z45018 Encounter for adjustment and management of other part of cardiac pacemaker: Secondary | ICD-10-CM

## 2020-07-23 HISTORY — PX: ICD GENERATOR CHANGEOUT: EP1231

## 2020-07-23 SURGERY — ICD GENERATOR CHANGEOUT
Anesthesia: Moderate Sedation

## 2020-07-23 SURGERY — PACEMAKER IMPLANT
Anesthesia: Choice

## 2020-07-23 MED ORDER — CHLORHEXIDINE GLUCONATE 4 % EX LIQD
4.0000 "application " | Freq: Once | CUTANEOUS | Status: AC
  Administered 2020-07-23: 4 via TOPICAL

## 2020-07-23 MED ORDER — HEPARIN (PORCINE) IN NACL 1000-0.9 UT/500ML-% IV SOLN
INTRAVENOUS | Status: DC | PRN
  Administered 2020-07-23: 500 mL

## 2020-07-23 MED ORDER — FENTANYL CITRATE (PF) 100 MCG/2ML IJ SOLN
INTRAMUSCULAR | Status: AC
  Filled 2020-07-23: qty 2

## 2020-07-23 MED ORDER — ONDANSETRON HCL 4 MG/2ML IJ SOLN: 4.0000 mg | Freq: Four times a day (QID) | INTRAMUSCULAR | Status: DC | PRN

## 2020-07-23 MED ORDER — FENTANYL CITRATE (PF) 100 MCG/2ML IJ SOLN
INTRAMUSCULAR | Status: DC | PRN
  Administered 2020-07-23 (×3): 25 ug via INTRAVENOUS

## 2020-07-23 MED ORDER — MIDAZOLAM HCL 2 MG/2ML IJ SOLN
INTRAMUSCULAR | Status: DC | PRN
  Administered 2020-07-23 (×3): 1 mg via INTRAVENOUS

## 2020-07-23 MED ORDER — SODIUM CHLORIDE 0.9 % IV SOLN
80.0000 mg | INTRAVENOUS | Status: AC
  Administered 2020-07-23: 80 mg
  Filled 2020-07-23: qty 80

## 2020-07-23 MED ORDER — HEPARIN (PORCINE) IN NACL 1000-0.9 UT/500ML-% IV SOLN
INTRAVENOUS | Status: AC
  Filled 2020-07-23: qty 1000

## 2020-07-23 MED ORDER — ACETAMINOPHEN 325 MG PO TABS: 325.0000 mg | ORAL_TABLET | ORAL | Status: DC | PRN

## 2020-07-23 MED ORDER — ASPIRIN EC 81 MG PO TBEC: 81.0000 mg | DELAYED_RELEASE_TABLET | Freq: Every day | ORAL | Status: DC

## 2020-07-23 MED ORDER — ATORVASTATIN CALCIUM 80 MG PO TABS: 80.0000 mg | ORAL_TABLET | Freq: Every day | ORAL | Status: DC

## 2020-07-23 MED ORDER — MIDAZOLAM HCL 2 MG/2ML IJ SOLN
INTRAMUSCULAR | Status: AC
  Filled 2020-07-23: qty 2

## 2020-07-23 MED ORDER — LIDOCAINE HCL (PF) 1 % IJ SOLN
INTRAMUSCULAR | Status: AC
  Filled 2020-07-23: qty 30

## 2020-07-23 MED ORDER — AMIODARONE HCL 200 MG PO TABS
200.0000 mg | ORAL_TABLET | Freq: Every day | ORAL | Status: DC
  Filled 2020-07-23: qty 1

## 2020-07-23 MED ORDER — LIDOCAINE HCL (PF) 1 % IJ SOLN
INTRAMUSCULAR | Status: DC | PRN
  Administered 2020-07-23: 20 mL

## 2020-07-23 MED ORDER — SODIUM CHLORIDE 0.9 % IV SOLN: INTRAVENOUS | Status: DC

## 2020-07-23 MED ORDER — CEFAZOLIN SODIUM-DEXTROSE 2-4 GM/100ML-% IV SOLN
2.0000 g | INTRAVENOUS | Status: AC
  Administered 2020-07-23: 2 g via INTRAVENOUS

## 2020-07-23 MED ORDER — ACETAMINOPHEN 325 MG PO TABS: 325.0000 mg | ORAL_TABLET | Freq: Four times a day (QID) | ORAL | Status: DC | PRN

## 2020-07-23 SURGICAL SUPPLY — 12 items
CABLE SURG 12 DISP A/V CHANNEL (MISCELLANEOUS) IMPLANT
COVER SURGICAL LIGHT HANDLE (MISCELLANEOUS) ×2 IMPLANT
DEVICE DSSCT PLSMBLD 3.0S LGHT (MISCELLANEOUS) ×1 IMPLANT
ICD VISIA MRI VR DVFB1D4 (ICD Generator) ×1 IMPLANT
PAD ELECT DEFIB RADIOL ZOLL (MISCELLANEOUS) ×2 IMPLANT
PLASMABLADE 3.0S W/LIGHT (MISCELLANEOUS) ×2
SUT DVC V-LOC 4-0 90 CLR P-12 (SUTURE) ×2
SUT DVC VLOC 3-0 CL 6 P-12 (SUTURE) ×2 IMPLANT
SUT VIC AB 2-0 CT1 (SUTURE) ×2 IMPLANT
SUTURE DVC V-LC4-0 90 CLR P-12 (SUTURE) ×1 IMPLANT
TRAY PACEMAKER INSERTION (PACKS) ×2 IMPLANT
VISIA MRI VR DVFB1D4 (ICD Generator) ×2 IMPLANT

## 2020-07-23 NOTE — Progress Notes (Signed)
Chrisangel Eskenazi is a 65 y.o. male here for cardiac device management.  History: Tanay Misuraca is a 65 y.o. male with a history of ICM s/p ICD placement in 2013. Previously Had VF/VT resulting in ICD shock Oct 14th with 2 VF related shocks in August and September of 2020 as well. Amiodarone initiated and continued with good effect. Prior h/o CAD with coronary stents.   Device has hit ERI and is here today to discuss generator change. His most recent EF was 30% EKG from October 2021 showed a QRS of 114 therefore is not a candidate for upgrade to CRT D.  His interrogation today shows brief episodes of nonsustained VT in September and October 2021 and nothing else.  He denies chest pain, chest pressure/discomfort, claudication, dyspnea, exertional chest pressure/discomfort, irregular heart beat, lower extremity edema, orthopnea, palpitations and paroxysmal nocturnal dyspnea   Here today for ICD generator replacement.

## 2020-07-23 NOTE — Progress Notes (Signed)
Spoke with Dr. Maisie Fus and pt to recover for an hour and okay to dc after

## 2022-10-06 ENCOUNTER — Emergency Department: Payer: 59

## 2022-10-06 ENCOUNTER — Emergency Department
Admission: EM | Admit: 2022-10-06 | Discharge: 2022-10-06 | Disposition: A | Discharge status: Home / Self Care | Payer: 59 | Attending: Emergency Medicine | Admitting: Emergency Medicine

## 2022-10-06 ENCOUNTER — Other Ambulatory Visit: Payer: Self-pay

## 2022-10-06 ENCOUNTER — Encounter: Payer: Self-pay | Admitting: Radiology

## 2022-10-06 DIAGNOSIS — N200 Calculus of kidney: Secondary | ICD-10-CM

## 2022-10-06 DIAGNOSIS — R1031 Right lower quadrant pain: Secondary | ICD-10-CM | POA: Diagnosis present

## 2022-10-06 DIAGNOSIS — R944 Abnormal results of kidney function studies: Secondary | ICD-10-CM | POA: Diagnosis not present

## 2022-10-06 DIAGNOSIS — N132 Hydronephrosis with renal and ureteral calculous obstruction: Secondary | ICD-10-CM | POA: Diagnosis not present

## 2022-10-06 LAB — URINALYSIS, ROUTINE W REFLEX MICROSCOPIC
Bacteria, UA: NONE SEEN
Bilirubin Urine: NEGATIVE
Glucose, UA: NEGATIVE mg/dL
Ketones, ur: 20 mg/dL — AB
Leukocytes,Ua: NEGATIVE
Nitrite: NEGATIVE
Protein, ur: 30 mg/dL — AB
Specific Gravity, Urine: 1.025 (ref 1.005–1.030)
pH: 5 (ref 5.0–8.0)

## 2022-10-06 LAB — CBC
HCT: 46 % (ref 39.0–52.0)
Hemoglobin: 14.7 g/dL (ref 13.0–17.0)
MCH: 29.6 pg (ref 26.0–34.0)
MCHC: 32 g/dL (ref 30.0–36.0)
MCV: 92.7 fL (ref 80.0–100.0)
Platelets: 197 10*3/uL (ref 150–400)
RBC: 4.96 MIL/uL (ref 4.22–5.81)
RDW: 13.4 % (ref 11.5–15.5)
WBC: 8.5 10*3/uL (ref 4.0–10.5)
nRBC: 0 % (ref 0.0–0.2)

## 2022-10-06 LAB — BASIC METABOLIC PANEL
Anion gap: 10 (ref 5–15)
BUN: 17 mg/dL (ref 8–23)
CO2: 25 mmol/L (ref 22–32)
Calcium: 9.2 mg/dL (ref 8.9–10.3)
Chloride: 102 mmol/L (ref 98–111)
Creatinine, Ser: 2.18 mg/dL — ABNORMAL HIGH (ref 0.61–1.24)
GFR, Estimated: 33 mL/min — ABNORMAL LOW (ref >60)
Glucose, Bld: 106 mg/dL — ABNORMAL HIGH (ref 70–99)
Potassium: 4.6 mmol/L (ref 3.5–5.1)
Sodium: 137 mmol/L (ref 135–145)

## 2022-10-06 MED ORDER — HYDROMORPHONE HCL 1 MG/ML IJ SOLN
0.5000 mg | Freq: Once | INTRAMUSCULAR | Status: AC
  Administered 2022-10-06: 0.5 mg via INTRAVENOUS
  Filled 2022-10-06: qty 0.5

## 2022-10-06 MED ORDER — SODIUM CHLORIDE 0.9 % IV BOLUS
1000.0000 mL | Freq: Once | INTRAVENOUS | Status: AC
  Administered 2022-10-06: 1000 mL via INTRAVENOUS

## 2022-10-06 MED ORDER — ONDANSETRON HCL 4 MG/2ML IJ SOLN
4.0000 mg | Freq: Once | INTRAMUSCULAR | Status: AC
  Administered 2022-10-06: 4 mg via INTRAVENOUS
  Filled 2022-10-06: qty 2

## 2022-10-06 MED ORDER — OXYCODONE HCL 5 MG PO TABS: 5.0000 mg | ORAL_TABLET | ORAL | 0 refills | Status: AC | PRN

## 2022-10-06 MED ORDER — ONDANSETRON 4 MG PO TBDP: 4.0000 mg | ORAL_TABLET | Freq: Three times a day (TID) | ORAL | 0 refills | Status: AC | PRN

## 2022-10-06 MED ORDER — HYDROMORPHONE HCL 1 MG/ML IJ SOLN
1.0000 mg | Freq: Once | INTRAMUSCULAR | Status: AC
  Administered 2022-10-06: 1 mg via INTRAVENOUS
  Filled 2022-10-06: qty 1

## 2022-10-06 NOTE — ED Provider Notes (Signed)
[facility] Provider Note    Event Date/Time   First MD Initiated Contact with Patient 10/06/22 1609     (approximate)   History   No chief complaint on file.   HPI  Elijah [name] is a 67 y.o. male past medical history significant for kidney stones who presents to the emergency department with right lower abdominal pain.  Started sudden onset earlier today.  Associate with nausea but no episodes of vomiting.  Ongoing pain.  Took Tylenol earlier without any significant improvement.  Feels similar to prior kidney stones.  States that he has passed some kidney stones but other kidney stones have needed lithotripsy.  States that he has not needed lithotripsy for multiple years and does not currently follow with a urologist.  Denies any fever, chills, nausea, vomiting, dysuria.     Physical Exam   Triage Vital Signs: ED Triage Vitals  Enc Vitals Group     BP 10/06/22 1533 (!) 141/87     Pulse Rate 10/06/22 1533 67     Resp 10/06/22 1533 17     Temp 10/06/22 1539 98.2 F (36.8 C)     Temp Source 10/06/22 1539 Oral     SpO2 10/06/22 1533 92 %     Weight --      Height 10/06/22 1534 (P) 5\' 7"  (1.702 m)     Head Circumference --      Peak Flow --      Pain Score 10/06/22 1534 7     Pain Loc --      Pain Edu? --      Excl. in GC? --     Most recent vital signs: Vitals:   10/06/22 1533 10/06/22 1539  BP: (!) 141/87   Pulse: 67   Resp: 17   Temp:  98.2 F (36.8 C)  SpO2: 92%     Physical Exam Constitutional:      General: He is in acute distress.     Appearance: He is well-developed.  HENT:     Head: Atraumatic.  Eyes:     Conjunctiva/sclera: Conjunctivae normal.  Cardiovascular:     Rate and Rhythm: Regular rhythm.  Pulmonary:     Effort: No respiratory distress.  Abdominal:     Tenderness: There is right CVA tenderness. There is no left CVA tenderness.  Musculoskeletal:        General: Normal range of motion.     Cervical back:  Normal range of motion.  Skin:    General: Skin is warm.     Capillary Refill: Capillary refill takes less than 2 seconds.  Neurological:     Mental Status: He is alert. Mental status is at baseline.     IMPRESSION / MDM / ASSESSMENT AND PLAN / ED COURSE  I reviewed the triage vital signs and the nursing notes.  Differential diagnosis including kidney stone, pyelonephritis, appendicitis  RADIOLOGY I independently reviewed imaging, my interpretation of imaging: CT scan abdomen and pelvis with hydronephrosis.  Read as hydronephrosis with obstructing kidney stone that is 7 mm.  LABS (all labs ordered are listed, but only abnormal results are displayed) Labs interpreted as -    Labs Reviewed  URINALYSIS, ROUTINE W REFLEX MICROSCOPIC - Abnormal; Notable for the following components:      Result Value   Color, Urine YELLOW (*)    APPearance CLEAR (*)    Hgb urine dipstick LARGE (*)    Ketones, ur 20 (*)  Protein, ur 30 (*)    All other components within normal limits  BASIC METABOLIC PANEL - Abnormal; Notable for the following components:   Glucose, Bld 106 (*)    Creatinine, Ser 2.18 (*)    GFR, Estimated 33 (*)    All other components within normal limits  CBC     MDM  No signs of pyelonephritis or an infected kidney stone.  Does have mildly elevated creatinine at 2.18 from baseline of 1.8.  Clinical Course as of 10/06/22 1942  Fri Oct 06, 2022  1710 Consulted urology and discussed the patient's case with Dr. Liliane Shi, recommended pain control with oxycodone and Tylenol only and avoiding any NSAIDs.  Discussed follow-up in clinic on Monday to discuss further treatment. [SM]    Clinical Course User Index [SM] Corena Herter, MD    On reevaluation continued to have pain given another dose of IV Dilaudid  On reevaluation had resolution of his pain and states he is feeling much better.  Will start the patient on oxycodone.  Discussed not taking any NSAIDs.  Discussed  Tylenol.  Discussed follow-up with urology and given return precautions for any fever or worsening pain.  PROCEDURES:  Critical Care performed: No  Procedures  Patient's presentation is most consistent with acute presentation with potential threat to life or bodily function.   MEDICATIONS ORDERED IN ED: Medications  sodium chloride 0.9 % bolus 1,000 mL (0 mLs Intravenous Stopped 10/06/22 1925)  HYDROmorphone (DILAUDID) injection 0.5 mg (0.5 mg Intravenous Given 10/06/22 1700)  ondansetron (ZOFRAN) injection 4 mg (4 mg Intravenous Given 10/06/22 1700)  sodium chloride 0.9 % bolus 1,000 mL (0 mLs Intravenous Stopped 10/06/22 1925)  HYDROmorphone (DILAUDID) injection 1 mg (1 mg Intravenous Given 10/06/22 1837)    FINAL CLINICAL IMPRESSION(S) / ED DIAGNOSES   Final diagnoses:  Kidney stone     Rx / DC Orders   ED Discharge Orders          Ordered    oxyCODONE (ROXICODONE) 5 MG immediate release tablet  Every 4 hours PRN        10/06/22 1906    ondansetron (ZOFRAN-ODT) 4 MG disintegrating tablet  Every 8 hours PRN        10/06/22 1906             Note:  This document was prepared using Dragon voice recognition software and may include unintentional dictation errors.   Corena Herter, MD 10/06/22 [phone]

## 2022-10-06 NOTE — ED Notes (Signed)
Pt verbalizes understanding of discharge instructions. Opportunity for questioning and answers were provided. Pt discharged from ED to home with wife.    

## 2022-10-06 NOTE — Discharge Instructions (Addendum)
You were seen in the emergency department and diagnosed with kidney stones.  You have a 7 mm stone to your right side.  It is importantly call urology and schedule an appointment, you should be seen on Monday.  You are given a prescription for narcotic pain medication.  Scheduled Tylenol 1000 mg every 6 hours for pain control.  If you are still having severe pain you can take an oxycodone.  If your pain continues despite these medications and you are in severe pain and return to the emergency department.  If you develop any fever return to the emergency department.  Pain control:  Acetaminophen (tylenol) - You can take 2 extra strength tablets (1000 mg) every 6 hours as needed for pain/fever.  **Do not take any NSAIDs including ibuprofen/Motrin/Aleve** Make sure you eat food/drink water when taking these medications.  You were given a prescription for narcotic pain medications.  Take only if in severe pain.  These are very addictive medications.  These medications can make you constipated.  If you need to take more than 1-2 doses, start a stool softner.  If you become constipated, take 1 capfull of MiraLAX, can repeat untill having regular bowel movements.  Keep this medication out of reach of any children.

## 2022-10-06 NOTE — ED Triage Notes (Signed)
Pt states he is having pain on the right side, that started Wednesday afternoon. Pt states he has had a kidney stone in the past and this feels the same.No urinary symptoms at this time.

## 2023-08-09 ENCOUNTER — Encounter: Payer: Self-pay | Admitting: Ophthalmology

## 2023-08-09 NOTE — Anesthesia Preprocedure Evaluation (Addendum)
 Anesthesia Evaluation  Patient identified by MRN, date of birth, ID band Patient awake    Reviewed: Allergy & Precautions, H&P , NPO status , Patient's Chart, lab work & pertinent test results  Airway Mallampati: IV  TM Distance: <3 FB Neck ROM: Full   Comment: Would expect difficult airway if intubation were needed Dental no notable dental hx. (+) Edentulous Upper, Edentulous Lower   Pulmonary shortness of breath, former smoker   Pulmonary exam normal breath sounds clear to auscultation       Cardiovascular hypertension, + angina  + CAD, + Past MI and +CHF  negative cardio ROS Normal cardiovascular exam+ pacemaker  Rhythm:Regular Rate:Normal  03-21-19 DOPPLER ECHO and OTHER SPECIAL PROCEDURES                 Aortic: No AR                      No AS                         118.4 cm/sec peak vel      5.6 mmHg peak grad                         2.5 mmHg mean grad         2.2 cm^2 by DOPPLER                 Mitral: MODERATE MR                No MS                         MV Inflow E Vel = 66.8 cm/sec     MV Annulus E'Vel = 4.6 cm/sec                         E/E'Ratio = 14.5              Tricuspid: MILD TR                    No TS                         263.3 cm/sec peak TR vel   32.7 mmHg peak RV pressure              Pulmonary: TRIVIAL PR                 No PS  _________________________________________________________________________________________  INTERPRETATION  MODERATE-TO-SEVERE LV SYSTOLIC DYSFUNCTION WITH AN ESTIMATED EF = 25-30 %  NORMAL RIGHT VENTRICULAR SYSTOLIC FUNCTION  MODERATE MITRAL VALVE INSUFFICIENCY  MILD TRICUSPID VALVE INSUFFICIENCY  NO VALVULAR STENOSIS  MOD LV ENLARGEMENT  MILD LA ENLARGEMENT     Neuro/Psych   Anxiety     Anxiety and panic attacksnegative neurological ROS  negative psych ROS   GI/Hepatic negative GI ROS, Neg liver ROS,GERD  ,,  Endo/Other  negative endocrine ROS    Renal/GU Renal  diseasenegative Renal ROS  negative genitourinary   Musculoskeletal negative musculoskeletal ROS (+)    Abdominal   Peds negative pediatric ROS (+)  Hematology negative hematology ROS (+) Blood dyscrasia, anemia   Anesthesia Other Findings Patient very drowsy this morning, states he was "up all night working third shift" Denies sleep apnea Reports PONV  Kidney  stones  Acid reflux Hyperlipidemia  Coronary artery disease Heart attack (HCC)  Anginal pain (HCC) Presence of permanent cardiac pacemaker CHF (congestive heart failure) (HCC) Shortness of breath dyspnea  History of panic attacks Anxiety  Anemia Restless leg syndrome  Vertigo Wears dentures      Reproductive/Obstetrics negative OB ROS                             Anesthesia Physical Anesthesia Plan  ASA: 3  Anesthesia Plan: MAC   Post-op Pain Management:    Induction: Intravenous  PONV Risk Score and Plan:   Airway Management Planned: Natural Airway and Nasal Cannula  Additional Equipment:   Intra-op Plan:   Post-operative Plan:   Informed Consent: I have reviewed the patients History and Physical, chart, labs and discussed the procedure including the risks, benefits and alternatives for the proposed anesthesia with the patient or authorized representative who has indicated his/her understanding and acceptance.     Dental Advisory Given  Plan Discussed with: Anesthesiologist, CRNA and Surgeon  Anesthesia Plan Comments: (Patient consented for risks of anesthesia including but not limited to:  - adverse reactions to medications - damage to eyes, teeth, lips or other oral mucosa - nerve damage due to positioning  - sore throat or hoarseness - Damage to heart, brain, nerves, lungs, other parts of body or loss of life  Patient voiced understanding and assent.)       Anesthesia Quick Evaluation

## 2023-08-13 NOTE — Discharge Instructions (Signed)

## 2023-08-15 ENCOUNTER — Encounter: Payer: Self-pay | Admitting: Ophthalmology

## 2023-08-15 ENCOUNTER — Other Ambulatory Visit: Payer: Self-pay

## 2023-08-15 ENCOUNTER — Ambulatory Visit: Payer: Self-pay | Admitting: Anesthesiology

## 2023-08-15 ENCOUNTER — Ambulatory Visit
Admission: RE | Admit: 2023-08-15 | Discharge: 2023-08-15 | Disposition: A | Discharge status: Home / Self Care | Payer: 59 | Attending: Ophthalmology | Admitting: Ophthalmology

## 2023-08-15 ENCOUNTER — Encounter
Admission: RE | Disposition: A | Discharge status: Home / Self Care | Payer: Self-pay | Source: Home / Self Care | Attending: Ophthalmology

## 2023-08-15 DIAGNOSIS — I130 Hypertensive heart and chronic kidney disease with heart failure and stage 1 through stage 4 chronic kidney disease, or unspecified chronic kidney disease: Secondary | ICD-10-CM | POA: Insufficient documentation

## 2023-08-15 DIAGNOSIS — K219 Gastro-esophageal reflux disease without esophagitis: Secondary | ICD-10-CM | POA: Insufficient documentation

## 2023-08-15 DIAGNOSIS — I5022 Chronic systolic (congestive) heart failure: Secondary | ICD-10-CM | POA: Insufficient documentation

## 2023-08-15 DIAGNOSIS — Z87891 Personal history of nicotine dependence: Secondary | ICD-10-CM | POA: Diagnosis not present

## 2023-08-15 DIAGNOSIS — H2511 Age-related nuclear cataract, right eye: Secondary | ICD-10-CM | POA: Diagnosis present

## 2023-08-15 DIAGNOSIS — I081 Rheumatic disorders of both mitral and tricuspid valves: Secondary | ICD-10-CM | POA: Diagnosis not present

## 2023-08-15 DIAGNOSIS — N1831 Chronic kidney disease, stage 3a: Secondary | ICD-10-CM | POA: Diagnosis not present

## 2023-08-15 DIAGNOSIS — I2510 Atherosclerotic heart disease of native coronary artery without angina pectoris: Secondary | ICD-10-CM | POA: Diagnosis not present

## 2023-08-15 DIAGNOSIS — I252 Old myocardial infarction: Secondary | ICD-10-CM | POA: Insufficient documentation

## 2023-08-15 DIAGNOSIS — Z950 Presence of cardiac pacemaker: Secondary | ICD-10-CM | POA: Diagnosis not present

## 2023-08-15 HISTORY — DX: Presence of dental prosthetic device (complete) (partial): Z97.2

## 2023-08-15 HISTORY — DX: Left ventricular failure, unspecified: I50.1

## 2023-08-15 HISTORY — DX: Benign paroxysmal vertigo, unspecified ear: H81.10

## 2023-08-15 HISTORY — DX: Presence of automatic (implantable) cardiac defibrillator: Z95.810

## 2023-08-15 HISTORY — DX: Atherosclerotic heart disease of native coronary artery with other forms of angina pectoris: I25.118

## 2023-08-15 HISTORY — DX: Gastro-esophageal reflux disease without esophagitis: K21.9

## 2023-08-15 HISTORY — DX: Chronic kidney disease, stage 3a: N18.31

## 2023-08-15 HISTORY — DX: Dizziness and giddiness: R42

## 2023-08-15 HISTORY — DX: Chronic systolic (congestive) heart failure: I50.22

## 2023-08-15 HISTORY — DX: Presence of coronary angioplasty implant and graft: Z95.5

## 2023-08-15 HISTORY — DX: Other specified postprocedural states: Z98.890

## 2023-08-15 HISTORY — DX: Personal history of other diseases of the circulatory system: Z86.79

## 2023-08-15 HISTORY — PX: CATARACT EXTRACTION W/PHACO: SHX586

## 2023-08-15 SURGERY — PHACOEMULSIFICATION, CATARACT, WITH IOL INSERTION
Anesthesia: Monitor Anesthesia Care | Site: Eye | Laterality: Right

## 2023-08-15 MED ORDER — MIDAZOLAM HCL 2 MG/2ML IJ SOLN
INTRAMUSCULAR | Status: DC | PRN
  Administered 2023-08-15: 1 mg via INTRAVENOUS

## 2023-08-15 MED ORDER — ARMC OPHTHALMIC DILATING DROPS
1.0000 | OPHTHALMIC | Status: DC | PRN
  Administered 2023-08-15 (×3): 1 via OPHTHALMIC

## 2023-08-15 MED ORDER — ONDANSETRON HCL 4 MG/2ML IJ SOLN
4.0000 mg | Freq: Once | INTRAMUSCULAR | Status: AC
Start: 2023-08-15 — End: 2023-08-15
  Administered 2023-08-15: 4 mg via INTRAVENOUS

## 2023-08-15 MED ORDER — ONDANSETRON HCL 4 MG/2ML IJ SOLN
INTRAMUSCULAR | Status: AC
  Filled 2023-08-15: qty 2

## 2023-08-15 MED ORDER — MIDAZOLAM HCL 2 MG/2ML IJ SOLN
INTRAMUSCULAR | Status: AC
Start: 2023-08-15 — End: ?
  Filled 2023-08-15: qty 2

## 2023-08-15 MED ORDER — FENTANYL CITRATE (PF) 100 MCG/2ML IJ SOLN
INTRAMUSCULAR | Status: DC | PRN
  Administered 2023-08-15: 50 ug via INTRAVENOUS

## 2023-08-15 MED ORDER — FENTANYL CITRATE (PF) 100 MCG/2ML IJ SOLN
INTRAMUSCULAR | Status: AC
  Filled 2023-08-15: qty 2

## 2023-08-15 MED ORDER — CEFUROXIME OPHTHALMIC INJECTION 1 MG/0.1 ML
INJECTION | OPHTHALMIC | Status: DC | PRN
  Administered 2023-08-15: .1 mL via INTRACAMERAL

## 2023-08-15 MED ORDER — SIGHTPATH DOSE#1 NA HYALUR & NA CHOND-NA HYALUR IO KIT
PACK | INTRAOCULAR | Status: DC | PRN
  Administered 2023-08-15: 1 via OPHTHALMIC

## 2023-08-15 MED ORDER — TETRACAINE HCL 0.5 % OP SOLN
OPHTHALMIC | Status: AC
  Filled 2023-08-15: qty 4

## 2023-08-15 MED ORDER — LIDOCAINE HCL (PF) 2 % IJ SOLN
INTRAOCULAR | Status: DC | PRN
  Administered 2023-08-15: 1 mL via INTRAMUSCULAR

## 2023-08-15 MED ORDER — SIGHTPATH DOSE#1 BSS IO SOLN
INTRAOCULAR | Status: DC | PRN
  Administered 2023-08-15: 64 mL via OPHTHALMIC

## 2023-08-15 MED ORDER — BRIMONIDINE TARTRATE-TIMOLOL 0.2-0.5 % OP SOLN
OPHTHALMIC | Status: DC | PRN
  Administered 2023-08-15: 1 [drp] via OPHTHALMIC

## 2023-08-15 MED ORDER — TETRACAINE HCL 0.5 % OP SOLN
1.0000 [drp] | OPHTHALMIC | Status: DC | PRN
  Administered 2023-08-15 (×3): 1 [drp] via OPHTHALMIC

## 2023-08-15 MED ORDER — ARMC OPHTHALMIC DILATING DROPS
OPHTHALMIC | Status: AC
  Filled 2023-08-15: qty 0.5

## 2023-08-15 MED ORDER — SIGHTPATH DOSE#1 BSS IO SOLN
INTRAOCULAR | Status: DC | PRN
  Administered 2023-08-15: 15 mL

## 2023-08-15 SURGICAL SUPPLY — 10 items
CATARACT SUITE SIGHTPATH (MISCELLANEOUS) ×1 IMPLANT
FEE CATARACT SUITE SIGHTPATH (MISCELLANEOUS) ×1 IMPLANT
GLOVE BIOGEL PI IND STRL 8 (GLOVE) ×1 IMPLANT
GLOVE SURG LX STRL 7.5 STRW (GLOVE) ×1 IMPLANT
GLOVE SURG PROTEXIS BL SZ6.5 (GLOVE) ×1 IMPLANT
GLOVE SURG SYN 6.5 PF PI BL (GLOVE) ×1 IMPLANT
LENS IOL TECNIS EYHANCE 21.5 (Intraocular Lens) IMPLANT
NDL FILTER BLUNT 18X1 1/2 (NEEDLE) ×1 IMPLANT
NEEDLE FILTER BLUNT 18X1 1/2 (NEEDLE) ×1 IMPLANT
SYR 3ML LL SCALE MARK (SYRINGE) ×1 IMPLANT

## 2023-08-15 NOTE — H&P (Signed)
 [facility]   Primary Care Physician:  Marisue Ivan, MD Ophthalmologist: Dr. Lockie Mola  Pre-Procedure History & Physical: HPI:  Elijah [name] is a 68 y.o. male here for ophthalmic surgery.   Past Medical History:  Diagnosis Date   Acid reflux    Anemia    Anginal pain (HCC)    Anxiety    Benign paroxysmal positional vertigo    CHF (congestive heart failure) (HCC)    Chronic systolic CHF (congestive heart failure) (HCC)    Coronary artery disease    Coronary artery disease of native artery of native heart with stable angina pectoris (HCC)    GERD (gastroesophageal reflux disease)    H/O ventricular fibrillation    Heart attack (HCC) 05/06/2011   History of heart artery stent    History of implantable cardiac defibrillator (ICD)    History of panic attacks    History of ventricular tachycardia    Hyperlipidemia    Kidney stones    Left heart failure with left ejection fraction less than or equal to 30 percent (HCC)    Presence of permanent cardiac pacemaker    Restless leg syndrome    Shortness of breath dyspnea    Stage 3a chronic kidney disease (HCC)    Vertigo    Wears dentures    full upper and lower    Past Surgical History:  Procedure Laterality Date   CARDIAC CATHETERIZATION     CORONARY ANGIOPLASTY     CORONARY STENT PLACEMENT     2012 -1 stent. 2013 -4 stents   CYSTOSCOPY W/ RETROGRADES Left 03/16/2015   Procedure: CYSTOSCOPY WITH RETROGRADE PYELOGRAM;  Surgeon: Crist Fat, MD;  Location: [facility] ORS;  Service: Urology;  Laterality: Left;   CYSTOSCOPY WITH STENT PLACEMENT Left 03/16/2015   Procedure: CYSTOSCOPY WITH STENT PLACEMENT;  Surgeon: Crist Fat, MD;  Location: [facility];  Service: Urology;  Laterality: Left;   CYSTOSCOPY/URETEROSCOPY/HOLMIUM LASER/STENT PLACEMENT Left 03/16/2015   Procedure: CYSTOSCOPY/URETEROSCOPY/HOLMIUM LASER/STENT PLACEMENT;  Surgeon: Crist Fat, MD;  Location: [facility];  Service:  Urology;  Laterality: Left;   CYSTOSCOPY/URETEROSCOPY/HOLMIUM LASER/STENT PLACEMENT Right 02/21/2018   Procedure: CYSTOSCOPY/URETEROSCOPY/HOLMIUM LASER/STENT PLACEMENT;  Surgeon: Vanna Scotland, MD;  Location: [facility];  Service: Urology;  Laterality: Right;   EXTRACORPOREAL SHOCK WAVE LITHOTRIPSY     ICD GENERATOR CHANGEOUT N/A 07/23/2020   Procedure: ICD GENERATOR CHANGEOUT;  Surgeon: Sharion Settler, MD;  Location: [facility] INVASIVE CV LAB;  Service: Cardiovascular;  Laterality: N/A;   INSERT / REPLACE / REMOVE PACEMAKER     PACEMAKER INSERTION      Prior to Admission medications   Medication Sig Start Date End Date Taking? Authorizing Provider  amiodarone (PACERONE) 200 MG tablet Take 200 mg by mouth daily.   Yes [provider]  aspirin EC 81 MG tablet Take 81 mg by mouth daily.    Yes [provider]  atorvastatin (LIPITOR) 80 MG tablet Take 80 mg by mouth daily at 6 PM.  02/03/15  Yes [provider]  gabapentin (NEURONTIN) 300 MG capsule Take 300 mg by mouth at bedtime.   Yes [provider]  metoprolol succinate (TOPROL-XL) 50 MG 24 hr tablet Take 50 mg by mouth daily. Take with or immediately following a meal.   Yes [provider]  Multiple Vitamins-Minerals (CENTRUM SILVER PO) Take 1 tablet by mouth daily.   Yes [provider]  sacubitril-valsartan (ENTRESTO) 24-26 MG Take 1 tablet by mouth 2 (two) times daily.  Yes [provider]  vitamin B-12 (CYANOCOBALAMIN) 1000 MCG tablet Take 1 tablet by mouth daily.   Yes [provider]  acetaminophen (TYLENOL) 325 MG tablet Take 325-650 mg by mouth every 6 (six) hours as needed for moderate pain or headache.    [provider]  esomeprazole (NEXIUM) 20 MG capsule Take 20 mg by mouth daily. Patient not taking: Reported on 08/09/2023    [provider]  ondansetron (ZOFRAN-ODT) 4 MG disintegrating tablet Take 1 tablet (4 mg total) by mouth every 8 (eight)  hours as needed for nausea or vomiting. Patient not taking: Reported on 08/09/2023 10/06/22   Corena Herter, MD    Allergies as of 07/18/2023 - Review Complete 10/06/2022  Allergen Reaction Noted   Hydrocodone-acetaminophen Nausea And Vomiting 02/15/2015   Morphine Nausea And Vomiting 02/15/2015   Sulfa antibiotics Rash 02/15/2015    Family History  Problem Relation Age of Onset   CAD Mother    Emphysema Father    Urolithiasis Neg Hx    Prostate cancer Neg Hx    Kidney disease Neg Hx    Kidney cancer Neg Hx     Social History   Socioeconomic History   Marital status: Married    Spouse name: Not on file   Number of children: Not on file   Years of education: Not on file   Highest education level: Not on file  Occupational History   Not on file  Tobacco Use   Smoking status: Former    Current packs/day: 0.00    Average packs/day: 1 pack/day for 29.8 years (29.8 ttl pk-yrs)    Types: Cigarettes    Start date: 26    Quit date: 04/06/2011    Years since quitting: 12.3   Smokeless tobacco: Never  Vaping Use   Vaping status: Never Used  Substance and Sexual Activity   Alcohol use: No    Alcohol/week: 0.0 standard drinks of alcohol   Drug use: No   Sexual activity: Not Currently  Other Topics Concern   Not on file  Social History Narrative   Not on file   Social Drivers of Health   Financial Resource Strain: Low Risk  (05/02/2023)   Received from [facility]   Overall Financial Resource Strain (CARDIA)    Difficulty of Paying Living Expenses: Not hard at all  Food Insecurity: No Food Insecurity (05/02/2023)   Received from [facility]   Hunger Vital Sign    Worried About Running Out of Food in the Last Year: Never true    Ran Out of Food in the Last Year: Never true  Transportation Needs: No Transportation Needs (05/02/2023)   Received from [facility] - Transportation    In the past 12 months, has  lack of transportation kept you from medical appointments or from getting medications?: No    Lack of Transportation (Non-Medical): No  Physical Activity: Not on file  Stress: Not on file  Social Connections: Not on file  Intimate Partner Violence: Not on file    Review of Systems: See HPI, otherwise negative ROS  Physical Exam: BP (!) 147/79   Pulse (!) 56   Temp (!) 97.2 F (36.2 C) (Temporal)   Resp 13   Ht 5' 7.01" (1.702 m)   Wt 93.4 kg   SpO2 98%   BMI 32.26 kg/m  General:   Alert,  pleasant and cooperative in NAD Head:  Normocephalic and atraumatic.  Lungs:  Clear to auscultation.    Heart:  Regular rate and rhythm.   Impression/Plan: Elijah [name] is here for ophthalmic surgery.  Risks, benefits, limitations, and alternatives regarding ophthalmic surgery have been reviewed with the patient.  Questions have been answered.  All parties agreeable.   Lockie Mola, MD  08/15/2023, 7:33 AM

## 2023-08-15 NOTE — Op Note (Signed)
 LOCATION:  [facility]   PREOPERATIVE DIAGNOSIS:    Nuclear sclerotic cataract right eye. H25.11   POSTOPERATIVE DIAGNOSIS:  Nuclear sclerotic cataract right eye.     PROCEDURE:  Phacoemusification with posterior chamber intraocular lens placement of the right eye   ULTRASOUND TIME: Procedure(s): CATARACT EXTRACTION PHACO AND INTRAOCULAR LENS PLACEMENT (IOC) RIGHT  10.30  00:48.2 (Right)  LENS:   Implant Name Type Inv. Item Serial No. Manufacturer Lot No. LRB No. Used Action  LENS IOL TECNIS EYHANCE 21.5 - [id number] Intraocular Lens LENS IOL TECNIS EYHANCE 21.5 [id number] [employer]  Right 1 Implanted         SURGEON:  Deirdre Evener, MD   ANESTHESIA:  Topical with tetracaine drops and 2% Xylocaine jelly, augmented with 1% preservative-free intracameral lidocaine.    COMPLICATIONS:  None.   DESCRIPTION OF PROCEDURE:  The patient was identified in the holding room and transported to the operating room and placed in the supine position under the operating microscope.  The right eye was identified as the operative eye and it was prepped and draped in the usual sterile ophthalmic fashion.   A 1 millimeter clear-corneal paracentesis was made at the 12:00 position.  0.5 ml of preservative-free 1% lidocaine was injected into the anterior chamber. The anterior chamber was filled with Viscoat viscoelastic.  A 2.4 millimeter keratome was used to make a near-clear corneal incision at the 9:00 position.  A curvilinear capsulorrhexis was made with a cystotome and capsulorrhexis forceps.  Balanced salt solution was used to hydrodissect and hydrodelineate the nucleus.   Phacoemulsification was then used in stop and chop fashion to remove the lens nucleus and epinucleus.  The remaining cortex was then removed using the irrigation and aspiration handpiece. Provisc was then placed into the capsular bag to distend it for lens placement.  A lens was then injected into the capsular bag.   The remaining viscoelastic was aspirated.   Wounds were hydrated with balanced salt solution.  The anterior chamber was inflated to a physiologic pressure with balanced salt solution.  No wound leaks were noted. Cefuroxime 0.1 ml of a 10mg /ml solution was injected into the anterior chamber for a dose of 1 mg of intracameral antibiotic at the completion of the case.   Timolol and Brimonidine drops were applied to the eye.  The patient was taken to the recovery room in stable condition without complications of anesthesia or surgery.   Arshad Oberholzer 08/15/2023, 8:40 AM

## 2023-08-15 NOTE — Transfer of Care (Signed)
 Immediate Anesthesia Transfer of Care Note  Patient: Elijah [name]  Procedure(s) Performed: CATARACT EXTRACTION PHACO AND INTRAOCULAR LENS PLACEMENT (IOC) RIGHT  10.30  00:48.2 (Right: Eye)  Patient Location: PACU  Anesthesia Type: MAC  Level of Consciousness: awake, alert  and patient cooperative  Airway and Oxygen Therapy: Patient Spontanous Breathing and Patient connected to supplemental oxygen  Post-op Assessment: Post-op Vital signs reviewed, Patient's Cardiovascular Status Stable, Respiratory Function Stable, Patent Airway and No signs of Nausea or vomiting  Post-op Vital Signs: Reviewed and stable  Complications: No notable events documented.

## 2023-08-15 NOTE — Anesthesia Postprocedure Evaluation (Signed)
 Anesthesia Post Note  Patient: FILEMON BRETON  Procedure(s) Performed: CATARACT EXTRACTION PHACO AND INTRAOCULAR LENS PLACEMENT (IOC) RIGHT  10.30  00:48.2 (Right: Eye)  Patient location during evaluation: PACU Anesthesia Type: MAC Level of consciousness: awake and alert Pain management: pain level controlled Vital Signs Assessment: post-procedure vital signs reviewed and stable Respiratory status: spontaneous breathing, nonlabored ventilation, respiratory function stable and patient connected to nasal cannula oxygen Cardiovascular status: stable and blood pressure returned to baseline Postop Assessment: no apparent nausea or vomiting Anesthetic complications: no   No notable events documented.   Last Vitals:  Vitals:   08/15/23 0842 08/15/23 0846  BP:  131/79  Pulse: (!) 51 (!) 52  Resp: 15 12  Temp: 36.8 C   SpO2: 97% 96%    Last Pain:  Vitals:   08/15/23 0846  TempSrc:   PainSc: 0-No pain                 Marisue Humble

## 2023-08-16 ENCOUNTER — Encounter: Payer: Self-pay | Admitting: Ophthalmology

## 2023-08-20 NOTE — Anesthesia Preprocedure Evaluation (Addendum)
 Anesthesia Evaluation  Patient identified by MRN, date of birth, ID band Patient awake    Reviewed: Allergy & Precautions, H&P , NPO status , Patient's Chart, lab work & pertinent test results  History of Anesthesia Complications (+) PONV and history of anesthetic complications  Airway Mallampati: IV  TM Distance: <3 FB Neck ROM: Full    Dental no notable dental hx. (+) Edentulous Upper, Edentulous Lower   Pulmonary shortness of breath, former smoker   Pulmonary exam normal breath sounds clear to auscultation       Cardiovascular hypertension, + angina  + CAD, + Past MI and +CHF  negative cardio ROS Normal cardiovascular exam+ pacemaker  Rhythm:Regular Rate:Normal  Normal cardiovascular exam+ pacemaker  Rhythm:Regular Rate:Normal   03-21-19 DOPPLER ECHO and OTHER SPECIAL PROCEDURES                 Aortic: No AR                      No AS                         118.4 cm/sec peak vel      5.6 mmHg peak grad                         2.5 mmHg mean grad         2.2 cm^2 by DOPPLER                 Mitral: MODERATE MR                No MS                         MV Inflow E Vel = 66.8 cm/sec     MV Annulus E'Vel = 4.6 cm/sec                         E/E'Ratio = 14.5              Tricuspid: MILD TR                    No TS                         263.3 cm/sec peak TR vel   32.7 mmHg peak RV pressure              Pulmonary: TRIVIAL PR                 No PS  _________________________________________________________________________________________  INTERPRETATION  MODERATE-TO-SEVERE LV SYSTOLIC DYSFUNCTION WITH AN ESTIMATED EF = 25-30 %  NORMAL RIGHT VENTRICULAR SYSTOLIC FUNCTION  MODERATE MITRAL VALVE INSUFFICIENCY  MILD TRICUSPID VALVE INSUFFICIENCY  NO VALVULAR STENOSIS  MOD LV ENLARGEMENT  MILD LA ENLARGEMENT       Neuro/Psych   Anxiety     negative neurological ROS  negative psych ROS   GI/Hepatic negative GI ROS, Neg  liver ROS,GERD  ,,  Endo/Other  negative endocrine ROS    Renal/GU Renal diseasenegative Renal ROS  negative genitourinary   Musculoskeletal negative musculoskeletal ROS (+)    Abdominal   Peds negative pediatric ROS (+)  Hematology negative hematology ROS (+) Blood dyscrasia, anemia   Anesthesia Other Findings  Previous cataract surgery 08-15-23 Dr. Juel Burrow anesthesiologist Medical  History  Kidney stones  Acid reflux Hyperlipidemia  Coronary artery disease Heart attack (HCC)  Anginal pain (HCC) Presence of permanent cardiac pacemaker CHF (congestive heart failure)  Shortness of breath dyspnea  History of panic attacks Anxiety  Anemia Restless leg syndrome  Vertigo Wears dentures  H/O ventricular fibrillation Chronic systolic CHF (congestive heart failure)  Stage 3a chronic kidney disease (HCC) Coronary artery disease of native artery of native heart with stable angina pectoris History of implantable cardiac defibrillator (ICD) History of heart artery stent  History of ventricular tachycardia Benign paroxysmal positional  vertigo  GERD (gastroesophageal reflux disease) Left heart failure with left ejection fraction less than or equal to 30 percent  PONV (postoperative nausea and vomiting)    Reproductive/Obstetrics negative OB ROS                             Anesthesia Physical Anesthesia Plan  ASA: 3  Anesthesia Plan: MAC   Post-op Pain Management:    Induction: Intravenous  PONV Risk Score and Plan:   Airway Management Planned: Natural Airway and Nasal Cannula  Additional Equipment:   Intra-op Plan:   Post-operative Plan:   Informed Consent: I have reviewed the patients History and Physical, chart, labs and discussed the procedure including the risks, benefits and alternatives for the proposed anesthesia with the patient or authorized representative who has indicated his/her understanding and acceptance.     Dental  Advisory Given  Plan Discussed with: Anesthesiologist, CRNA and Surgeon  Anesthesia Plan Comments: (Patient consented for risks of anesthesia including but not limited to:  - adverse reactions to medications - damage to eyes, teeth, lips or other oral mucosa - nerve damage due to positioning  - sore throat or hoarseness - Damage to heart, brain, nerves, lungs, other parts of body or loss of life  Patient voiced understanding and assent.)        Anesthesia Quick Evaluation

## 2023-08-27 NOTE — Discharge Instructions (Signed)

## 2023-08-29 ENCOUNTER — Ambulatory Visit
Admission: RE | Admit: 2023-08-29 | Discharge: 2023-08-29 | Disposition: A | Discharge status: Home / Self Care | Payer: 59 | Attending: Ophthalmology | Admitting: Ophthalmology

## 2023-08-29 ENCOUNTER — Encounter: Payer: Self-pay | Admitting: Ophthalmology

## 2023-08-29 ENCOUNTER — Other Ambulatory Visit: Payer: Self-pay

## 2023-08-29 ENCOUNTER — Ambulatory Visit: Payer: Self-pay | Admitting: Anesthesiology

## 2023-08-29 ENCOUNTER — Encounter
Admission: RE | Disposition: A | Discharge status: Home / Self Care | Payer: Self-pay | Source: Home / Self Care | Attending: Ophthalmology

## 2023-08-29 DIAGNOSIS — I5022 Chronic systolic (congestive) heart failure: Secondary | ICD-10-CM | POA: Insufficient documentation

## 2023-08-29 DIAGNOSIS — I252 Old myocardial infarction: Secondary | ICD-10-CM | POA: Insufficient documentation

## 2023-08-29 DIAGNOSIS — N1831 Chronic kidney disease, stage 3a: Secondary | ICD-10-CM | POA: Diagnosis not present

## 2023-08-29 DIAGNOSIS — Z87891 Personal history of nicotine dependence: Secondary | ICD-10-CM | POA: Insufficient documentation

## 2023-08-29 DIAGNOSIS — H2512 Age-related nuclear cataract, left eye: Secondary | ICD-10-CM | POA: Diagnosis present

## 2023-08-29 DIAGNOSIS — Z95810 Presence of automatic (implantable) cardiac defibrillator: Secondary | ICD-10-CM | POA: Insufficient documentation

## 2023-08-29 DIAGNOSIS — I2510 Atherosclerotic heart disease of native coronary artery without angina pectoris: Secondary | ICD-10-CM | POA: Diagnosis not present

## 2023-08-29 DIAGNOSIS — Z955 Presence of coronary angioplasty implant and graft: Secondary | ICD-10-CM | POA: Insufficient documentation

## 2023-08-29 DIAGNOSIS — I130 Hypertensive heart and chronic kidney disease with heart failure and stage 1 through stage 4 chronic kidney disease, or unspecified chronic kidney disease: Secondary | ICD-10-CM | POA: Diagnosis not present

## 2023-08-29 HISTORY — PX: CATARACT EXTRACTION W/PHACO: SHX586

## 2023-08-29 SURGERY — PHACOEMULSIFICATION, CATARACT, WITH IOL INSERTION
Anesthesia: Monitor Anesthesia Care | Site: Eye | Laterality: Left

## 2023-08-29 MED ORDER — ONDANSETRON HCL 4 MG/2ML IJ SOLN
INTRAMUSCULAR | Status: DC | PRN
  Administered 2023-08-29: 4 mg via INTRAVENOUS

## 2023-08-29 MED ORDER — TETRACAINE HCL 0.5 % OP SOLN
OPHTHALMIC | Status: AC
  Filled 2023-08-29: qty 4

## 2023-08-29 MED ORDER — ARMC OPHTHALMIC DILATING DROPS
1.0000 | OPHTHALMIC | Status: DC | PRN
  Administered 2023-08-29 (×3): 1 via OPHTHALMIC

## 2023-08-29 MED ORDER — ONDANSETRON HCL 4 MG/2ML IJ SOLN
INTRAMUSCULAR | Status: AC
  Filled 2023-08-29: qty 2

## 2023-08-29 MED ORDER — LIDOCAINE HCL (PF) 2 % IJ SOLN
INTRAOCULAR | Status: DC | PRN
  Administered 2023-08-29: 2 mL

## 2023-08-29 MED ORDER — FENTANYL CITRATE (PF) 100 MCG/2ML IJ SOLN
INTRAMUSCULAR | Status: DC | PRN
  Administered 2023-08-29: 50 ug via INTRAVENOUS

## 2023-08-29 MED ORDER — MIDAZOLAM HCL 2 MG/2ML IJ SOLN
INTRAMUSCULAR | Status: DC | PRN
  Administered 2023-08-29 (×2): 1 mg via INTRAVENOUS

## 2023-08-29 MED ORDER — SIGHTPATH DOSE#1 NA HYALUR & NA CHOND-NA HYALUR IO KIT
PACK | INTRAOCULAR | Status: DC | PRN
  Administered 2023-08-29: 1 via OPHTHALMIC

## 2023-08-29 MED ORDER — CEFUROXIME OPHTHALMIC INJECTION 1 MG/0.1 ML
INJECTION | OPHTHALMIC | Status: DC | PRN
  Administered 2023-08-29: 1 mg via INTRACAMERAL

## 2023-08-29 MED ORDER — BRIMONIDINE TARTRATE-TIMOLOL 0.2-0.5 % OP SOLN
OPHTHALMIC | Status: DC | PRN
  Administered 2023-08-29: 1 [drp] via OPHTHALMIC

## 2023-08-29 MED ORDER — SIGHTPATH DOSE#1 BSS IO SOLN
INTRAOCULAR | Status: DC | PRN
  Administered 2023-08-29: 56 mL via OPHTHALMIC

## 2023-08-29 MED ORDER — MIDAZOLAM HCL 2 MG/2ML IJ SOLN
INTRAMUSCULAR | Status: AC
  Filled 2023-08-29: qty 2

## 2023-08-29 MED ORDER — TETRACAINE HCL 0.5 % OP SOLN
1.0000 [drp] | OPHTHALMIC | Status: DC | PRN
  Administered 2023-08-29 (×3): 1 [drp] via OPHTHALMIC

## 2023-08-29 MED ORDER — SIGHTPATH DOSE#1 BSS IO SOLN
INTRAOCULAR | Status: DC | PRN
  Administered 2023-08-29: 15 mL via INTRAOCULAR

## 2023-08-29 MED ORDER — ARMC OPHTHALMIC DILATING DROPS
OPHTHALMIC | Status: AC
  Filled 2023-08-29: qty 0.5

## 2023-08-29 MED ORDER — FENTANYL CITRATE (PF) 100 MCG/2ML IJ SOLN
INTRAMUSCULAR | Status: AC
  Filled 2023-08-29: qty 2

## 2023-08-29 SURGICAL SUPPLY — 10 items
CATARACT SUITE SIGHTPATH (MISCELLANEOUS) ×1 IMPLANT
FEE CATARACT SUITE SIGHTPATH (MISCELLANEOUS) ×1 IMPLANT
GLOVE BIOGEL PI IND STRL 8 (GLOVE) ×1 IMPLANT
GLOVE SURG LX STRL 7.5 STRW (GLOVE) ×1 IMPLANT
GLOVE SURG PROTEXIS BL SZ6.5 (GLOVE) ×1 IMPLANT
GLOVE SURG SYN 6.5 PF PI BL (GLOVE) ×1 IMPLANT
LENS IOL TECNIS EYHANCE 22.0 (Intraocular Lens) IMPLANT
NDL FILTER BLUNT 18X1 1/2 (NEEDLE) ×1 IMPLANT
NEEDLE FILTER BLUNT 18X1 1/2 (NEEDLE) ×1 IMPLANT
SYR 3ML LL SCALE MARK (SYRINGE) ×1 IMPLANT

## 2023-08-29 NOTE — Transfer of Care (Signed)
 Immediate Anesthesia Transfer of Care Note  Patient: Elijah [name]  Procedure(s) Performed: CATARACT EXTRACTION PHACO AND INTRAOCULAR LENS PLACEMENT (IOC) LEFT 4.96 00:34.6 (Left: Eye)  Patient Location: PACU  Anesthesia Type: MAC  Level of Consciousness: awake, alert  and patient cooperative  Airway and Oxygen Therapy: Patient Spontanous Breathing and Patient connected to supplemental oxygen  Post-op Assessment: Post-op Vital signs reviewed, Patient's Cardiovascular Status Stable, Respiratory Function Stable, Patent Airway and No signs of Nausea or vomiting  Post-op Vital Signs: Reviewed and stable  Complications: No notable events documented.

## 2023-08-29 NOTE — Op Note (Signed)
 OPERATIVE NOTE  Elijah [name] [id number] 08/29/2023   PREOPERATIVE DIAGNOSIS:  Nuclear sclerotic cataract left eye. H25.12   POSTOPERATIVE DIAGNOSIS:    Nuclear sclerotic cataract left eye.     PROCEDURE:  Phacoemusification with posterior chamber intraocular lens placement of the left eye  Ultrasound time: Procedure(s): CATARACT EXTRACTION PHACO AND INTRAOCULAR LENS PLACEMENT (IOC) LEFT 4.96 00:34.6 (Left)  LENS:   Implant Name Type Inv. Item Serial No. Manufacturer Lot No. LRB No. Used Action  LENS IOL TECNIS EYHANCE 22.0 - [id number] Intraocular Lens LENS IOL TECNIS EYHANCE 22.0 [id number] [employer]  Left 1 Implanted      SURGEON:  Deirdre Evener, MD   ANESTHESIA:  Topical with tetracaine drops and 2% Xylocaine jelly, augmented with 1% preservative-free intracameral lidocaine.    COMPLICATIONS:  None.   DESCRIPTION OF PROCEDURE:  The patient was identified in the holding room and transported to the operating room and placed in the supine position under the operating microscope.  The left eye was identified as the operative eye and it was prepped and draped in the usual sterile ophthalmic fashion.   A 1 millimeter clear-corneal paracentesis was made at the 1:30 position.  0.5 ml of preservative-free 1% lidocaine was injected into the anterior chamber.  The anterior chamber was filled with Viscoat viscoelastic.  A 2.4 millimeter keratome was used to make a near-clear corneal incision at the 10:30 position.  .  A curvilinear capsulorrhexis was made with a cystotome and capsulorrhexis forceps.  Balanced salt solution was used to hydrodissect and hydrodelineate the nucleus.   Phacoemulsification was then used in stop and chop fashion to remove the lens nucleus and epinucleus.  The remaining cortex was then removed using the irrigation and aspiration handpiece. Provisc was then placed into the capsular bag to distend it for lens placement.  A lens was then injected into the  capsular bag.  The remaining viscoelastic was aspirated.   Wounds were hydrated with balanced salt solution.  The anterior chamber was inflated to a physiologic pressure with balanced salt solution.  No wound leaks were noted. Cefuroxime 0.1 ml of a 10mg /ml solution was injected into the anterior chamber for a dose of 1 mg of intracameral antibiotic at the completion of the case.   Timolol and Brimonidine drops were applied to the eye.  The patient was taken to the recovery room in stable condition without complications of anesthesia or surgery.  Elijah [name] 08/29/2023, 9:54 AM

## 2023-08-29 NOTE — H&P (Signed)
 [facility]   Primary Care Physician:  Marisue Ivan, MD Ophthalmologist: Dr. Lockie Mola  Pre-Procedure History & Physical: HPI:  Elijah [name] is a 68 y.o. male here for ophthalmic surgery.   Past Medical History:  Diagnosis Date   Acid reflux    Anemia    Anginal pain (HCC)    Anxiety    Benign paroxysmal positional vertigo    CHF (congestive heart failure) (HCC)    Chronic systolic CHF (congestive heart failure) (HCC)    Coronary artery disease    Coronary artery disease of native artery of native heart with stable angina pectoris (HCC)    GERD (gastroesophageal reflux disease)    H/O ventricular fibrillation    Heart attack (HCC) 05/06/2011   History of heart artery stent    History of implantable cardiac defibrillator (ICD)    History of panic attacks    History of ventricular tachycardia    Hyperlipidemia    Kidney stones    Left heart failure with left ejection fraction less than or equal to 30 percent (HCC)    PONV (postoperative nausea and vomiting)    Presence of permanent cardiac pacemaker    Restless leg syndrome    Shortness of breath dyspnea    Stage 3a chronic kidney disease (HCC)    Vertigo    Wears dentures    full upper and lower    Past Surgical History:  Procedure Laterality Date   CARDIAC CATHETERIZATION     CATARACT EXTRACTION W/PHACO Right 08/15/2023   Procedure: CATARACT EXTRACTION PHACO AND INTRAOCULAR LENS PLACEMENT (IOC) RIGHT  10.30  00:48.2;  Surgeon: Lockie Mola, MD;  Location: [facility];  Service: Ophthalmology;  Laterality: Right;   CORONARY ANGIOPLASTY     CORONARY STENT PLACEMENT     2012 -1 stent. 2013 -4 stents   CYSTOSCOPY W/ RETROGRADES Left 03/16/2015   Procedure: CYSTOSCOPY WITH RETROGRADE PYELOGRAM;  Surgeon: Crist Fat, MD;  Location: [facility];  Service: Urology;  Laterality: Left;   CYSTOSCOPY WITH STENT PLACEMENT Left 03/16/2015   Procedure: CYSTOSCOPY WITH STENT  PLACEMENT;  Surgeon: Crist Fat, MD;  Location: [facility];  Service: Urology;  Laterality: Left;   CYSTOSCOPY/URETEROSCOPY/HOLMIUM LASER/STENT PLACEMENT Left 03/16/2015   Procedure: CYSTOSCOPY/URETEROSCOPY/HOLMIUM LASER/STENT PLACEMENT;  Surgeon: Crist Fat, MD;  Location: [facility];  Service: Urology;  Laterality: Left;   CYSTOSCOPY/URETEROSCOPY/HOLMIUM LASER/STENT PLACEMENT Right 02/21/2018   Procedure: CYSTOSCOPY/URETEROSCOPY/HOLMIUM LASER/STENT PLACEMENT;  Surgeon: Vanna Scotland, MD;  Location: [facility];  Service: Urology;  Laterality: Right;   EXTRACORPOREAL SHOCK WAVE LITHOTRIPSY     ICD GENERATOR CHANGEOUT N/A 07/23/2020   Procedure: ICD GENERATOR CHANGEOUT;  Surgeon: Sharion Settler, MD;  Location: [facility];  Service: Cardiovascular;  Laterality: N/A;   INSERT / REPLACE / REMOVE PACEMAKER     PACEMAKER INSERTION      Prior to Admission medications   Medication Sig Start Date End Date Taking? Authorizing Provider  acetaminophen (TYLENOL) 325 MG tablet Take 325-650 mg by mouth every 6 (six) hours as needed for moderate pain or headache.    [provider]  amiodarone (PACERONE) 200 MG tablet Take 200 mg by mouth daily.    [provider]  aspirin EC 81 MG tablet Take 81 mg by mouth daily.     [provider]  atorvastatin (LIPITOR) 80 MG tablet Take 80 mg by mouth daily at 6 PM.  02/03/15   [provider]  esomeprazole (NEXIUM) 20 MG  capsule Take 20 mg by mouth daily. Patient not taking: Reported on 08/09/2023    [provider]  gabapentin (NEURONTIN) 300 MG capsule Take 300 mg by mouth at bedtime.    [provider]  metoprolol succinate (TOPROL-XL) 50 MG 24 hr tablet Take 50 mg by mouth daily. Take with or immediately following a meal.    [provider]  Multiple Vitamins-Minerals (CENTRUM SILVER PO) Take 1 tablet by mouth daily.    [provider]  ondansetron (ZOFRAN-ODT) 4 MG  disintegrating tablet Take 1 tablet (4 mg total) by mouth every 8 (eight) hours as needed for nausea or vomiting. Patient not taking: Reported on 08/09/2023 10/06/22   Corena Herter, MD  sacubitril-valsartan (ENTRESTO) 24-26 MG Take 1 tablet by mouth 2 (two) times daily.    [provider]  vitamin B-12 (CYANOCOBALAMIN) 1000 MCG tablet Take 1 tablet by mouth daily.    [provider]    Allergies as of 07/18/2023 - Review Complete 10/06/2022  Allergen Reaction Noted   Hydrocodone-acetaminophen Nausea And Vomiting 02/15/2015   Morphine Nausea And Vomiting 02/15/2015   Sulfa antibiotics Rash 02/15/2015    Family History  Problem Relation Age of Onset   CAD Mother    Emphysema Father    Urolithiasis Neg Hx    Prostate cancer Neg Hx    Kidney disease Neg Hx    Kidney cancer Neg Hx     Social History   Socioeconomic History   Marital status: Married    Spouse name: Not on file   Number of children: Not on file   Years of education: Not on file   Highest education level: Not on file  Occupational History   Not on file  Tobacco Use   Smoking status: Former    Current packs/day: 0.00    Average packs/day: 1 pack/day for 29.8 years (29.8 ttl pk-yrs)    Types: Cigarettes    Start date: 69    Quit date: 04/06/2011    Years since quitting: 12.4   Smokeless tobacco: Never  Vaping Use   Vaping status: Never Used  Substance and Sexual Activity   Alcohol use: No    Alcohol/week: 0.0 standard drinks of alcohol   Drug use: No   Sexual activity: Not Currently  Other Topics Concern   Not on file  Social History Narrative   Not on file   Social Drivers of Health   Financial Resource Strain: Low Risk  (05/02/2023)   Received from [facility]   Overall Financial Resource Strain (CARDIA)    Difficulty of Paying Living Expenses: Not hard at all  Food Insecurity: No Food Insecurity (05/02/2023)   Received from [facility]    Hunger Vital Sign    Worried About Running Out of Food in the Last Year: Never true    Ran Out of Food in the Last Year: Never true  Transportation Needs: No Transportation Needs (05/02/2023)   Received from [facility] - Transportation    In the past 12 months, has lack of transportation kept you from medical appointments or from getting medications?: No    Lack of Transportation (Non-Medical): No  Physical Activity: Not on file  Stress: Not on file  Social Connections: Not on file  Intimate Partner Violence: Not on file    Review of Systems: See HPI, otherwise negative ROS  Physical Exam: Ht 5\' 7"  (1.702 m)   Wt 93.4 kg  BMI 32.26 kg/m  General:   Alert,  pleasant and cooperative in NAD Head:  Normocephalic and atraumatic. Lungs:  Clear to auscultation.    Heart:  Regular rate and rhythm.   Impression/Plan: Addison Lank is here for ophthalmic surgery.  Risks, benefits, limitations, and alternatives regarding ophthalmic surgery have been reviewed with the patient.  Questions have been answered.  All parties agreeable.   Lockie Mola, MD  08/29/2023, 8:49 AM

## 2023-08-29 NOTE — Anesthesia Postprocedure Evaluation (Signed)
 Anesthesia Post Note  Patient: Elijah [name]  Procedure(s) Performed: CATARACT EXTRACTION PHACO AND INTRAOCULAR LENS PLACEMENT (IOC) LEFT 4.96 00:34.6 (Left: Eye)  Patient location during evaluation: PACU Anesthesia Type: MAC Level of consciousness: awake and alert Pain management: pain level controlled Vital Signs Assessment: post-procedure vital signs reviewed and stable Respiratory status: spontaneous breathing, nonlabored ventilation, respiratory function stable and patient connected to nasal cannula oxygen Cardiovascular status: stable and blood pressure returned to baseline Postop Assessment: no apparent nausea or vomiting Anesthetic complications: no   No notable events documented.   Last Vitals:  Vitals:   08/29/23 0955 08/29/23 1000  BP: 129/83 108/78  Pulse: (!) 55 61  Resp: 13 13  Temp: (!) 36.1 C (!) 36.1 C  SpO2: 95% 94%    Last Pain:  Vitals:   08/29/23 1000  TempSrc:   PainSc: 0-No pain                 Jaysiah Marchetta C Asher Babilonia

## 2023-08-30 ENCOUNTER — Encounter: Payer: Self-pay | Admitting: Ophthalmology

## 2023-12-18 NOTE — Progress Notes (Addendum)
 Established Patient Visit   Chief Complaint: Chief Complaint  Patient presents with  . Follow-up    ECHO and Carotid follow up    Date of Service: 12/18/2023 Date of Birth: [birth year] PCP: Alla Amis, MD [phone] [facility] [address] [facility] [place] [address]  History of Present Illness:   Mr. Elijah [name] is a 68 y.o.male patient that presents for 3 week f/u.    Presents for f/u r/t: Ventricular tachycardia s/p ICD placement Chronic systolic CHF/ischemic cardiomyopathy Dx: Moderate dilated cardiomyopathy EF 35% by echo, 07/14/2011 EF 25-30% by 2D ECHO 03/21/2019 EF 35% by 2D ECHO 12/14/23 Defibrillator placement November 2013 for dilated cardiomyopathy; Battery changeout 07/23/2020 GDMT: Sim, metoprolol  XL CAD s/p OM1 DES 05/12/2011 with STEMI and acute in-stent thrombosis 05/13/2011, CTO of RCA 07/12/2011 with Dr. Dickey at [facility] 05/12/11 with 30% prox LAD, 100% mid RCA lesion with left-to-right collaterals, 80% OM1 lesion. Xience stent to OM1.  STEMI 05/13/11 with acute in-stent thrombosis. Medically managed. Continued to be symptomatic. Admitted at [facility] 2/6-2/04/2012. cMRI revealed infarct in lateral wall c/w occluded OM1. Stent showed no viability. Significant viability throughout inferior wall with no e/o infarct. PCI with Dr. Dickey 07/12/2011. The RCA was reconstruction with approximately 100 mm of DES. During the procedure, the wire did go subintimally and a small dissection occurred. Additionally, a small RV branch was jailed off as well. Transferred to CCU. CCU stay significant for junctional tachycardia. Discharged 07/16/21 with cardiac rehab. ETT Myoview 03/21/2019 - Pt exercised per Bruce protocol for 9:00 minutes, achieved work level of max METS 10.40.  Achieved 93% of maximum age-predicted HR.  Conclusions: Moderate to severe LV systolic dysfunction with akinesis of the inferior and posterior lateral wall. Fixed inferior and posterior lateral myocardial perfusion defect  consistent with previous infarct and/or scar without evidence of myocardial ischemia. ASCVD secondary prevention with aspirin , metoprolol  XL, and atorvastatin . Last LDL 55 mg/dL 4/78/74.  Hypertension Hyperlipidemia Bilateral carotid artery stenosis less than 50% 2019 CKD stage III  Seen 11/27/23 and reported some dizziness with lying flat and working as Curator under cars, but none otherwise. He does report some vision changes recently, following with Ophthalmology. Carotid US  ordered. Chronic stable dyspnea. Denies edema, orthopnea or weight gain. Surveillance ECHO ordered.  He presents for follow up today to discuss carotid US  and ECHO results. Preliminary carotid US  shows no significant stenosis. 2D ECHO 12/14/23 revealed moderate LV systolic function with no LVH, EF 35%, normal RV systolic function. Mild valvular regurgitation, no valvular stenosis. Prior 2D ECHO 03/21/2019 revealed moderate to severe LV systolic function with EF 25-30%. Normal RV systolic function. Moderate MR, mild TR. No valvular stenosis. Chronic stable dyspnea. Denies edema, orthopnea or weight gain. He does not weigh daily. He is following a low salt diet mostly without occasional foods higher in salt. Dizziness as above and unchanged. He denies chest pain or indigestion. H/O tobacco use, quit 13-14 year ago. Denies exertional dizziness, presyncope, syncope. He has h/o syncope, which resolved after starting amiodarone . He continues amiodarone  200 mg daily. He does not exercise. 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 He denies exertional symptoms or functional decline. He denies bleeding on aspirin . He continues GDMT with metoprolol  XL and Entresto. BP 124/70, HR 61 today. He continues ASCVD secondary prevention with aspirin , metoprolol  XL, and atorvastatin . Last LDL 55 mg/dL 4/78/74. CMP 10/24/23 with stable renal function and electrolytes. He had an ETT Myoview 03/21/2019 - Pt exercised per Bruce protocol  for 9:00  minutes, achieved work level of max METS 10.40.  Achieved 93% of maximum age-predicted HR.  Conclusions: Moderate to severe LV systolic dysfunction with akinesis of the inferior and posterior lateral wall. Fixed inferior and posterior lateral myocardial perfusion defect consistent with previous infarct and/or scar without evidence of myocardial ischemia. Last ICD interrogation 08/21/2023 revealed estimated battery life 9.5 years. 1 episode of VT sensed without any interventions. <0.1% VP. 100% VS.   Past Medical and Surgical History  Past Medical History Past Medical History:  Diagnosis Date  . BPPV (benign paroxysmal positional vertigo)    Hx of BPPV  . Coronary artery disease    S/P MI and stent placement (2012, 2013)- followed by Dr. Hester  . GERD (gastroesophageal reflux disease)   . Hyperlipidemia   . Hypertension   . Kidney stones   . Systolic congestive heart failure (CMS/HHS-HCC)    EF 30%- 05/2011; followed by Dr. Hester    Past Surgical History He has a past surgical history that includes Coronary artery stenting; Insert / replace / remove pacemaker; Lithotripsy; Benign tumor removal from above left eyebrow; and Colonoscopy (N/A, 06/02/2013).   Medications and Allergies  Current Medications  Current Outpatient Medications on File Prior to Visit  Medication Sig Dispense Refill  . acetaminophen  (TYLENOL ) 325 MG tablet Take 650 mg by mouth every 4 (four) hours as needed.    SABRA albuterol MDI, PROVENTIL, VENTOLIN, PROAIR, HFA 90 mcg/actuation inhaler INHALE 2 INHALATIONS INTO THE LUNGS EVERY 6 HOURS AS NEEDED 6.7 g 3  . AMIOdarone  (PACERONE ) 200 MG tablet Take 1 tablet (200 mg total) by mouth once daily 90 tablet 3  . aspirin  81 MG EC tablet Take 81 mg by mouth daily.    . atorvastatin  (LIPITOR) 80 MG tablet Take 1 tablet (80 mg total) by mouth once daily 90 tablet 3  . cyanocobalamin (VITAMIN B12) 1000 MCG tablet Take 1,000 mcg by mouth daily.    . diphenhydrAMINE (BENADRYL)  25 mg capsule Take 25 mg by mouth every 6 (six) hours as needed    . gabapentin (NEURONTIN) 300 MG capsule TAKE 1 CAPSULE(300 MG) BY MOUTH AT BEDTIME 100 capsule 1  . sacubitriL-valsartan (ENTRESTO) 24-26 mg tablet Take 1 tablet by mouth 2 (two) times daily 180 tablet 1  . esomeprazole (NEXIUM) 20 MG DR capsule Take 20 mg by mouth once daily (Patient not taking: Reported on 12/18/2023)    . tadalafiL (CIALIS) 10 MG tablet Take 1 tablet (10 mg total) by mouth once daily as needed for Erectile Dysfunction for up to 30 days Take dose 30-45 min prior to anticipated sexual activity. 30 tablet 0  . tamsulosin  (FLOMAX ) 0.4 mg capsule Take 0.4 mg by mouth once daily Take 30 minutes after same meal each day. (Patient not taking: Reported on 12/18/2023)     No current facility-administered medications on file prior to visit.    Allergies: Morphine , Sulfa (sulfonamide antibiotics), and Vicodin [hydrocodone-acetaminophen ]  Social and Family History  Social History  reports that he quit smoking about 14 years ago. His smoking use included cigarettes. He started smoking about 44 years ago. He has a 60 pack-year smoking history. He has been exposed to tobacco smoke. He has never used smokeless tobacco. He reports that he does not drink alcohol and does not use drugs.  Family History Family History  Problem Relation Name Age of Onset  . Myocardial Infarction (Heart attack) Mother    . No Known Problems Father    .  No Known Problems Sister    . No Known Problems Brother      Review of Systems   Review of Systems  Constitutional:        Denies weight gain  Respiratory:  Positive for shortness of breath.   Cardiovascular:  Negative for chest pain, palpitations, orthopnea and leg swelling.  Gastrointestinal:  Negative for heartburn.  Neurological:  Negative for dizziness and loss of consciousness.  Endo/Heme/Allergies:  Does not bruise/bleed easily.      Physical Examination   Vitals:BP 124/70    Pulse 61   Ht 168.9 cm (5' 6.5)   Wt 94.3 kg (208 lb)   SpO2 98%   BMI 33.07 kg/m  Ht:168.9 cm (5' 6.5) Wt:94.3 kg (208 lb) ADJ:Anib surface area is 2.1 meters squared. Body mass index is 33.07 kg/m.  Physical Exam Vitals reviewed.  Constitutional:      General: He is not in acute distress.    Appearance: Normal appearance.   Cardiovascular:     Rate and Rhythm: Normal rate and regular rhythm.     Heart sounds: Normal heart sounds. No murmur heard. Pulmonary:     Effort: Pulmonary effort is normal. No respiratory distress.     Breath sounds: Normal breath sounds.   Musculoskeletal:     Right lower leg: No edema.     Left lower leg: No edema.   Neurological:     Mental Status: He is alert.       Data & Results   Recent Labs    03/21/22 0738 04/11/23 1142 10/24/23 0723  CHOLTOTAL 162 158 139  HDL 44.5 52.5 51.2  LDLCALC 50 79 55  VLDL 68 26 33  TRIG 339* 131 164    Recent Labs    03/21/22 0738 04/11/23 1142 10/24/23 0723  NA 142 143 143  K 4.1 4.7 4.2  BUN 15 11 13   CREATININE 1.4* 1.3 1.4*  CO2 28.3 30.0 31.7  GLUCOSE 95 90 82  ALT 19 15 14   AST 20 16 15   TBILI 1.4* 1.2 1.4*  ALB 4.2 4.2 4.3    Recent Labs    03/21/22 0738 04/11/23 1142 04/17/23 1058  WBC 5.2 4.5 8.4  HGB 15.0 14.3 14.3  HCT 46.7 42.9 43.2  MCV 91.2 91.7 91.3  PLT 207 179 179    Recent Labs    09/20/21 0745 03/21/22 0738 04/11/23 1142 11/27/23 1005  TSH  --   --   --  3.065  HGBA1C 5.8* 5.7* 5.6  --         Assessment   68 y.o. male with  Encounter Diagnoses  Name Primary?  . Coronary artery disease involving native coronary artery of native heart without angina pectoris Yes  . Chronic systolic CHF (EF 69% - 03/20/21) - followed by Dr. Ammon   . Mixed hyperlipidemia (LDL 55 - 10/24/23)   . Essential hypertension    . Ventricular fibrillation seen on cardiac monitor (CMS/HHS-HCC)   . Bilateral carotid artery stenosis   . Cardiac defibrillator in  place   . Ventricular tachycardia (CMS/HHS-HCC)   . History of tobacco use (Quit 2021; > 25 yr pack hx)   . Class 1 obesity due to excess calories with serious comorbidity and body mass index (BMI) of 33.0 to 33.9 in adult      Plan   Orders Placed This Encounter  Procedures  . Cardiac MRI heart stress morphology and function with and without contrast   Ischemic cardiomyopathy -  2D ECHO with EF 35%, improved from prior EF on 2020 ECHO of 25-30%. Continue GDMT with metoprolol  XL and Entresto. Asymptomatic bradycardia. Start jardiance for GDMT optimization. Consider adding MRA at follow up if BP will tolerate. Refer to [facility] HF clinic. Continue ASCVD secondary prevention with aspirin , metoprolol  XL, and atorvastatin . Last LDL 55 mg/dL 4/78/74. CMP 10/24/23 with stable renal function and electrolytes. Will proceed with stress cardiac MRI to evaluate for ischemia and inferior wall viability after CTO intervention in 2013. Inferior wall infarct with fixed defect on 2020 Myoview. cMRI 2013 revealed significant viability throughout inferior wall with no e/o infarct. Known h/o lateral wall infarct with OM1 occluded stent with no viability on 2013 cMRI. Chronic stable DOE without worsening. Without chest pain. With mild improvement in systolic function but persistent reduced function, will r/o ischemia. Leads verified as MR conditional.   Continue amiodarone  200 mg qd. Without recurrent syncope. Last ICD interrogation 08/21/2023 revealed estimated battery life 9.5 years. 1 episode of VT sensed without any interventions. <0.1% VP. 100% VS.  Amiodarone  monitoring: CXR 04/17/2023 - no acute cardiopulmonary abnormality TSH WNL 11/27/23, LFTs WNL 10/24/23 Dilated eye exam 08/2023 EKG 11/27/23 reveals sinus bradycardia, HR 54 bpm, QTC 451 ms. No significant change in EKG when compared to prior 09/2022 except for no PVCs appreciated on today's EKG.   Return in about 4 weeks (around 01/15/2024).   Attestation  Statement:   I personally performed the service, non-incident to. [facility])   MARY TINNIE LAUNIE MAIDEN, NP

## 2024-02-10 ENCOUNTER — Emergency Department

## 2024-02-10 ENCOUNTER — Emergency Department
Admission: EM | Admit: 2024-02-10 | Discharge: 2024-02-10 | Disposition: A | Discharge status: Home / Self Care | Attending: Emergency Medicine | Admitting: Emergency Medicine

## 2024-02-10 ENCOUNTER — Other Ambulatory Visit: Payer: Self-pay

## 2024-02-10 DIAGNOSIS — Z4502 Encounter for adjustment and management of automatic implantable cardiac defibrillator: Secondary | ICD-10-CM | POA: Diagnosis present

## 2024-02-10 DIAGNOSIS — I509 Heart failure, unspecified: Secondary | ICD-10-CM | POA: Insufficient documentation

## 2024-02-10 DIAGNOSIS — I2510 Atherosclerotic heart disease of native coronary artery without angina pectoris: Secondary | ICD-10-CM | POA: Diagnosis not present

## 2024-02-10 DIAGNOSIS — N189 Chronic kidney disease, unspecified: Secondary | ICD-10-CM | POA: Insufficient documentation

## 2024-02-10 LAB — CBC
HCT: 44.8 % (ref 39.0–52.0)
Hemoglobin: 14.7 g/dL (ref 13.0–17.0)
MCH: 30 pg (ref 26.0–34.0)
MCHC: 32.8 g/dL (ref 30.0–36.0)
MCV: 91.4 fL (ref 80.0–100.0)
Platelets: 174 K/uL (ref 150–400)
RBC: 4.9 MIL/uL (ref 4.22–5.81)
RDW: 13.3 % (ref 11.5–15.5)
WBC: 4.7 K/uL (ref 4.0–10.5)
nRBC: 0 % (ref 0.0–0.2)

## 2024-02-10 LAB — BASIC METABOLIC PANEL WITH GFR
Anion gap: 8 (ref 5–15)
BUN: 13 mg/dL (ref 8–23)
CO2: 26 mmol/L (ref 22–32)
Calcium: 8.9 mg/dL (ref 8.9–10.3)
Chloride: 106 mmol/L (ref 98–111)
Creatinine, Ser: 1.41 mg/dL — ABNORMAL HIGH (ref 0.61–1.24)
GFR, Estimated: 55 mL/min — ABNORMAL LOW (ref >60)
Glucose, Bld: 132 mg/dL — ABNORMAL HIGH (ref 70–99)
Potassium: 3.9 mmol/L (ref 3.5–5.1)
Sodium: 140 mmol/L (ref 135–145)

## 2024-02-10 LAB — TROPONIN I (HIGH SENSITIVITY)
Troponin I (High Sensitivity): 6 ng/L (ref <18)
Troponin I (High Sensitivity): 6 ng/L (ref <18)

## 2024-02-10 MED ORDER — MAGNESIUM SULFATE IN D5W 1-5 GM/100ML-% IV SOLN
1.0000 g | Freq: Once | INTRAVENOUS | Status: AC
  Administered 2024-02-10: 1 g via INTRAVENOUS
  Filled 2024-02-10: qty 100

## 2024-02-10 MED ORDER — AMIODARONE HCL 400 MG PO TABS: 400.0000 mg | ORAL_TABLET | Freq: Every day | ORAL | 1 refills | Status: AC

## 2024-02-10 NOTE — ED Provider Notes (Signed)
 [facility] Provider Note    Event Date/Time   First MD Initiated Contact with Patient 02/10/24 [phone]     (approximate)   History   Pacemaker Problem   HPI  Elijah [name] is a 68 y.o. male with a history of CHF ventricular tachycardia CKD CAD with a cardiac defibrillator who presents after receiving a shock this morning around 745 when he rolled over in bed.  At this time he feels well and has no complaints, no shortness of breath or chest pain.  Review of records demonstrates device was last interrogated about 2 months ago on July 15, confirmed to have around 9-1/2 years of battery life left at that time     Physical Exam   Triage Vital Signs: ED Triage Vitals [02/10/24 0935]  Encounter Vitals Group     BP (!) 140/77     Girls Systolic BP Percentile      Girls Diastolic BP Percentile      Boys Systolic BP Percentile      Boys Diastolic BP Percentile      Pulse Rate 61     Resp 18     Temp 97.9 F (36.6 C)     Temp Source Oral     SpO2 98 %     Weight      Height      Head Circumference      Peak Flow      Pain Score 4     Pain Loc      Pain Education      Exclude from Growth Chart     Most recent vital signs: Vitals:   02/10/24 1400 02/10/24 1418  BP: 127/83   Pulse: (!) 50   Resp: 20   Temp:  97.8 F (36.6 C)  SpO2: 96%      General: Awake, no distress.  CV:  Good peripheral perfusion.  Regular rate and rhythm Resp:  Normal effort.  Clear to auscultation bilaterally Abd:  No distention.  Other:  No calf pain or swelling   ED Results / Procedures / Treatments   Labs (all labs ordered are listed, but only abnormal results are displayed) Labs Reviewed  BASIC METABOLIC PANEL WITH GFR - Abnormal; Notable for the following components:      Result Value   Glucose, Bld 132 (*)    Creatinine, Ser 1.41 (*)    GFR, Estimated 55 (*)    All other components within normal limits  CBC  TROPONIN I (HIGH SENSITIVITY)   TROPONIN I (HIGH SENSITIVITY)     EKG  ED ECG REPORT I, Lamar Price, the attending physician, personally viewed and interpreted this ECG.  Date: 02/10/2024  Rhythm: normal sinus rhythm QRS Axis: normal Intervals: normal ST/T Wave abnormalities: normal Narrative Interpretation: no evidence of acute ischemia    RADIOLOGY Chest x-ray viewed interpret by me, no acute abnormality    PROCEDURES:  Critical Care performed:   Procedures   MEDICATIONS ORDERED IN ED: Medications  magnesium  sulfate IVPB 1 g 100 mL (0 g Intravenous Stopped 02/10/24 1400)     IMPRESSION / MDM / ASSESSMENT AND PLAN / ED COURSE  I reviewed the triage vital signs and the nursing notes. Patient's presentation is most consistent with severe exacerbation of chronic illness.  Patient presents after defibrillator firing, overall he is feeling well however.  He does have a defibrillator because of a history of VT/VF, he is on amiodarone  200 mg daily.  Will  check enzymes, chest x-ray, keep on the cardiac monitor  Initial enzymes are reassuring, lab work electrolytes are reassuring, chest x-ray is unremarkable  Discussed with Dr. Denyse Bathe who recommends 1 g of magnesium , appropriate for discharge with outpatient follow-up with cardiology, recommends increasing amiodarone  to 400 mg daily        FINAL CLINICAL IMPRESSION(S) / ED DIAGNOSES   Final diagnoses:  ICD (implantable cardioverter-defibrillator) discharge     Rx / DC Orders   ED Discharge Orders          Ordered    amiodarone  (PACERONE ) 400 MG tablet  Daily        02/10/24 1233             Note:  This document was prepared using Dragon voice recognition software and may include unintentional dictation errors.   Arlander Charleston, MD 02/10/24 [phone]

## 2024-02-10 NOTE — Discharge Instructions (Signed)
 Your defibrillator appears to be working appropriately, as we discussed, please increase your amiodarone  to 400 mg daily, this was recommended by our cardiologist, follow-up closely with Dr. Florencio

## 2024-02-10 NOTE — ED Triage Notes (Addendum)
 Pt to ED via POV from home. Pt ambulatory to triage. Pt reports was laying in bed and rolled over and felt his defibrillator go off. Pt reports last interrogated 1-2 months ago. Pt has [employer] defibrillator. Pt reports pain on defibrillator and left chest. Denies SOB. No blood thinners.
